# Patient Record
Sex: Female | Born: 1995 | Race: Black or African American | Hispanic: No | Marital: Single | Smoking: Never smoker
Health system: Southern US, Community
[De-identification: ages and names within clinical notes are randomized; demographics above are authoritative.]

## PROBLEM LIST (undated history)

## (undated) DIAGNOSIS — Z789 Other specified health status: Secondary | ICD-10-CM

---
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
## 2017-09-14 ENCOUNTER — Encounter: Payer: Self-pay | Admitting: Obstetrics and Gynecology

## 2017-09-14 ENCOUNTER — Encounter: Payer: Medicaid Other | Admitting: Obstetrics and Gynecology

## 2017-09-14 ENCOUNTER — Ambulatory Visit (INDEPENDENT_AMBULATORY_CARE_PROVIDER_SITE_OTHER): Payer: Medicaid Other | Admitting: Obstetrics and Gynecology

## 2017-09-14 VITALS — BP 128/68 | HR 107 | Wt 171.0 lb

## 2017-09-14 DIAGNOSIS — E669 Obesity, unspecified: Secondary | ICD-10-CM

## 2017-09-14 DIAGNOSIS — O219 Vomiting of pregnancy, unspecified: Secondary | ICD-10-CM

## 2017-09-14 DIAGNOSIS — Z3A01 Less than 8 weeks gestation of pregnancy: Secondary | ICD-10-CM

## 2017-09-14 DIAGNOSIS — Z3400 Encounter for supervision of normal first pregnancy, unspecified trimester: Secondary | ICD-10-CM | POA: Insufficient documentation

## 2017-09-14 DIAGNOSIS — Z6841 Body Mass Index (BMI) 40.0 and over, adult: Secondary | ICD-10-CM | POA: Insufficient documentation

## 2017-09-14 MED ORDER — DOXYLAMINE-PYRIDOXINE 10-10 MG PO TBEC: 2.0000 | DELAYED_RELEASE_TABLET | Freq: Every day | ORAL | 5 refills | Status: DC

## 2017-09-14 MED ORDER — PRENATAL VITAMINS 0.8 MG PO TABS
1.0000 | ORAL_TABLET | Freq: Every day | ORAL | 12 refills | Status: DC
Start: 2017-09-14 — End: 2021-09-01

## 2017-09-14 NOTE — Progress Notes (Signed)
09/14/2017   Chief Complaint: Missed period  Transfer of Care Patient: no  History of Present Illness: Amy Duncan is a 22 y.o. G1P0000 7447w2d based on Patient's last menstrual period was 07/25/2017 (exact date). with an Estimated Date of Delivery: 05/01/18, with the above CC.   Her periods were: regular periods every 28 days She was using no method when she conceived.  She has Positive signs or symptoms of nausea/vomiting of pregnancy. She has Negative signs or symptoms of miscarriage or preterm labor She identifies Negative Zika risk factors for her and her partner On any different medications around the time she conceived/early pregnancy: No  History of varicella: No   ROS: A 12-point review of systems was performed and negative, except as stated in the above HPI.  OBGYN History: As per HPI. OB History  Gravida Para Term Preterm AB Living  1 0 0     0  SAB TAB Ectopic Multiple Live Births               # Outcome Date GA Lbr Len/2nd Weight Sex Delivery Anes PTL Lv  1 Current               Any issues with any prior pregnancies: not applicable Any prior children are healthy, doing well, without any problems or issues: not applicable History of pap smears: No.  History of STIs: No   Past Medical History: History reviewed. No pertinent past medical history.  Past Surgical History: History reviewed. No pertinent surgical history.  Family History:  History reviewed. No pertinent family history. She denies any female cancers, bleeding or blood clotting disorders.  She denies any history of mental retardation, birth defects or genetic disorders in her or the FOB's history  Social History:  Social History   Socioeconomic History  . Marital status: Single    Spouse name: Not on file  . Number of children: Not on file  . Years of education: Not on file  . Highest education level: Not on file  Social Needs  . Financial resource strain: Not on file  . Food insecurity -  worry: Not on file  . Food insecurity - inability: Not on file  . Transportation needs - medical: Not on file  . Transportation needs - non-medical: Not on file  Occupational History  . Not on file  Tobacco Use  . Smoking status: Former Games developermoker  . Smokeless tobacco: Never Used  . Tobacco comment: Quit with current pregnancy  Substance and Sexual Activity  . Alcohol use: No    Frequency: Never  . Drug use: No  . Sexual activity: Yes    Birth control/protection: None  Other Topics Concern  . Not on file  Social History Narrative  . Not on file   Any pets in the household: no Discussed cat liter precautions  Allergy: No Known Allergies  Current Outpatient Medications: No current outpatient medications on file.   Physical Exam:   BP 128/68   Pulse (!) 107   Wt 171 lb (77.6 kg)   LMP 07/25/2017 (Exact Date)  There is no height or weight on file to calculate BMI. Constitutional: Well nourished, well developed female in no acute distress.  Neck:  Supple, normal appearance, and no thyromegaly  Cardiovascular: S1, S2 normal, no murmur, rub or gallop, regular rate and rhythm Respiratory:  Clear to auscultation bilateral. Normal respiratory effort Abdomen: positive bowel sounds and no masses, hernias; diffusely non tender to palpation, non distended Breasts: breasts appear  normal, no suspicious masses, no skin or nipple changes or axillary nodes. Neuro/Psych:  Normal mood and affect.  Skin:  Warm and dry.  Lymphatic:  No inguinal lymphadenopathy.   Patient declined pelvic exam. Vaginal swab collected for gonorrhea and chlamydia  Transabdominal US showed intrauterine gestational sac, no yolk sac or fetal pole seen. Patient declined transvaginal US.    Assessment: Amy Duncan is a 22 y.o. G1P0000 [redacted]w[redacted]d based on Patient's last menstrual period was 07/25/2017 (exact date). with an Estimated Date of Delivery: 05/01/18,  for prenatal care.  Plan:  1) Avoid alcoholic beverages. 2)  Patient encouraged not to smoke.  3) Discontinue the use of all non-medicinal drugs and chemicals.  4) Take prenatal vitamins daily.  5) Seatbelt use advised 6) Nutrition, food safety (fish, cheese advisories, and high nitrite foods) and exercise discussed. 7) Hospital and practice style delivering at [facility] discussed  8) Patient is asked about travel to areas at risk for the Zika virus, and counseled to avoid travel and exposure to mosquitoes or sexual partners who may have themselves been exposed to the virus. Testing is discussed, and will be ordered as appropriate.  9) Childbirth classes at [facility] advised 10) Genetic Screening, such as with 1st Trimester Screening, cell free fetal DNA, AFP testing, and Ultrasound, as well as with amniocentesis and CVS as appropriate, is discussed with patient. She plans to have genetic testing this pregnancy. Desires CF DNA 11) Hemoglobin electrophoresis, will add on 12)  Prescriptions for prenatal vitamins and diclegis sent. 13) Return next week for transvaginal US for viability and dating.   Problem list reviewed and updated.  Amy Idler MD [facility], [facility] 09/14/2017  5:57 PM

## 2017-09-15 LAB — RPR+RH+ABO+RUB AB+AB SCR+CB...
Antibody Screen: NEGATIVE
HEMATOCRIT: 39.2 % (ref 34.0–46.6)
HEMOGLOBIN: 13.8 g/dL (ref 11.1–15.9)
HIV Screen 4th Generation wRfx: NONREACTIVE
Hepatitis B Surface Ag: NEGATIVE
MCH: 29.9 pg (ref 26.6–33.0)
MCHC: 35.2 g/dL (ref 31.5–35.7)
MCV: 85 fL (ref 79–97)
Platelets: 377 10*3/uL (ref 150–379)
RBC: 4.62 x10E6/uL (ref 3.77–5.28)
RDW: 13.3 % (ref 12.3–15.4)
RH TYPE: POSITIVE
RPR: NONREACTIVE
Rubella Antibodies, IGG: 3.33 index (ref >0.99)
Varicella zoster IgG: <135 index — ABNORMAL LOW (ref >165)
WBC: 12.2 10*3/uL — ABNORMAL HIGH (ref 3.4–10.8)

## 2017-09-16 LAB — GC/CHLAMYDIA PROBE AMP
CHLAMYDIA, DNA PROBE: NEGATIVE
Neisseria gonorrhoeae by PCR: NEGATIVE

## 2017-09-16 LAB — SPECIMEN STATUS REPORT

## 2017-09-16 LAB — URINE DRUG PANEL 7
Amphetamines, Urine: NEGATIVE ng/mL
BARBITURATE QUANT UR: NEGATIVE ng/mL
BENZODIAZEPINE QUANT UR: NEGATIVE ng/mL
Cannabinoid Quant, Ur: NEGATIVE ng/mL
Cocaine (Metab.): NEGATIVE ng/mL
OPIATE QUANT UR: NEGATIVE ng/mL
PCP Quant, Ur: NEGATIVE ng/mL

## 2017-09-16 LAB — URINE CULTURE

## 2017-09-16 LAB — SICKLE CELL SCREEN: Sickle Cell Screen: NEGATIVE

## 2017-09-16 NOTE — Progress Notes (Signed)
Please call patient with results. She is not immune to varicella so she should avoid people with chicken pox and she can be revaccinated postpartum. Thank you!

## 2017-09-22 ENCOUNTER — Encounter: Payer: Self-pay | Admitting: Advanced Practice Midwife

## 2017-09-22 ENCOUNTER — Ambulatory Visit (INDEPENDENT_AMBULATORY_CARE_PROVIDER_SITE_OTHER): Payer: Medicaid Other

## 2017-09-22 ENCOUNTER — Other Ambulatory Visit: Payer: Self-pay | Admitting: Obstetrics and Gynecology

## 2017-09-22 ENCOUNTER — Ambulatory Visit (INDEPENDENT_AMBULATORY_CARE_PROVIDER_SITE_OTHER): Payer: Medicaid Other | Admitting: Advanced Practice Midwife

## 2017-09-22 VITALS — BP 120/60 | Wt 174.0 lb

## 2017-09-22 DIAGNOSIS — Z3400 Encounter for supervision of normal first pregnancy, unspecified trimester: Secondary | ICD-10-CM | POA: Diagnosis not present

## 2017-09-22 NOTE — Progress Notes (Signed)
Dating scan today: Location: [facility] Date of Service: 09/22/2017   Indications:dating Findings:  [name] intrauterine pregnancy is visualized with a CRL consistent with 4044w0d gestation, giving an (U/S) EDD of 05/11/2018.   Cardiac activity is absent CRL measurement: 9.7 mm Yolk sac is possible small yolk sac is seen. and early anatomy is normal. Amnion: is visualized   Right Ovary is normal in appearance. Left Ovary is normal appearance. Corpus luteal cyst:  is not visualized Survey of the adnexa demonstrates no adnexal masses. There is no free peritoneal fluid in the cul de sac.  Impression: 1. Cardiac activity is absent. 2. GS is in lower uterine segment. 3. Possible small yolk sac is seen. 4. [name] measures 1.78 x 2.20 cm   Recommendations: 1.Clinical correlation with the patient's History and Physical Exam. 2. Need to follow up.  Amy Duncan Leodis BinetP Patel, RDMS ________________________________________________________  Discussion with patient about possibility of early gestation vs non-viable pregnancy and the need for follow up u/s. All questions answered. She has had some spotting a couple of weeks ago and some ongoing cramping. She denies heavy bleeding. Precautions given. Patient to return in 2 weeks for f/u.  Tresea MallJane Cohen Boettner, CNM

## 2017-09-26 NOTE — Progress Notes (Signed)
This patient can be offered medical or surgical management of missed abortion.

## 2017-09-27 ENCOUNTER — Telehealth: Payer: Self-pay | Admitting: Obstetrics and Gynecology

## 2017-09-27 NOTE — Telephone Encounter (Signed)
Did you call pt?

## 2017-09-27 NOTE — Telephone Encounter (Signed)
Patient is calling due to missed call. Please advise °

## 2017-09-27 NOTE — Telephone Encounter (Signed)
Called and spoke with patient regarding results/follow up.

## 2017-09-27 NOTE — Telephone Encounter (Signed)
call

## 2017-10-06 ENCOUNTER — Other Ambulatory Visit: Payer: Self-pay | Admitting: Advanced Practice Midwife

## 2017-10-06 ENCOUNTER — Ambulatory Visit (INDEPENDENT_AMBULATORY_CARE_PROVIDER_SITE_OTHER): Payer: Medicaid Other | Admitting: Maternal Newborn

## 2017-10-06 ENCOUNTER — Ambulatory Visit (INDEPENDENT_AMBULATORY_CARE_PROVIDER_SITE_OTHER): Payer: Medicaid Other

## 2017-10-06 VITALS — BP 118/68 | Wt 175.0 lb

## 2017-10-06 DIAGNOSIS — Z3400 Encounter for supervision of normal first pregnancy, unspecified trimester: Secondary | ICD-10-CM

## 2017-10-06 DIAGNOSIS — O039 Complete or unspecified spontaneous abortion without complication: Secondary | ICD-10-CM

## 2017-10-08 ENCOUNTER — Encounter: Payer: Self-pay | Admitting: Maternal Newborn

## 2017-10-08 NOTE — Progress Notes (Signed)
    Prenatal Care Visit  Subjective  Amy Duncan is a 22 y.o. G1P0000 at 3322w5d being seen today to determine pregnancy viability.  Patient reports some cramping and light bleeding.   ----------------------------------------------------------------------------------- The following portions of the patient's history were reviewed and updated as appropriate: allergies, current medications, past family history, past medical history, past social history, past surgical history and problem list. Problem list updated.  Objective  Blood pressure 118/68, weight 175 lb (79.4 kg), last menstrual period 07/25/2017. No acute distress.  Assessment   22 y.o. G1P0000 at 2122w5d EDD 05/01/2018, by Last Menstrual Period presenting for assessment of fetal viability.  Plan  Ultrasound today shows 6w 4d gestation without cardiac activity, consistent with miscarriage.  ULTRASOUND REPORT  Location: [facility] Date of Service: 10/06/2017   Indications:VIABILITY Findings:  [name] intrauterine pregnancy is visualized with a CRL consistent with 827w4d gestation, giving an (U/S) EDD of 05/28/2018. Cardiac activity is absent   CRL measurement: 7.2 mm Yolk sac is visualized and appears abnormal and early anatomy is normal. Amnion: not visualized   Right Ovary is normal in appearance. Left Ovary is normal appearance. Corpus luteal cyst:  is not visualized Survey of the adnexa demonstrates no adnexal masses. There is no free peritoneal fluid in the cul de sac.  Impression: 1. Miscarriage  Recommendations: 1.Clinical correlation with the patient's History and Physical Exam.   Mital bahen P Patel,RDMS  Condolences were offered to the patient. We briefly discussed management options including expectant management, medical management, and surgical management, and that approximately 80% of first trimester miscarriages will pass successfully but may require a time frame of up to 8 weeks  (ACOG Practice Bulletin 150 May 2015 "Early Pregnancy Loss").  ? At this time, the patient desires expectant management. She was advised to contact the office should she change her mind, experience any complications, or have any follow up questions.  ? The patient is Rh positive and Rhogam is therefore not indicated.  ? A total of 15 minutes were spent in face-to-face contact with the patient during this encounter with over half of that time devoted to counseling and coordination of care.  ? Marcelyn BruinsJacelyn Schmid, CNM 10/06/2016

## 2018-07-24 ENCOUNTER — Encounter (HOSPITAL_COMMUNITY): Payer: Self-pay

## 2019-01-29 ENCOUNTER — Other Ambulatory Visit: Payer: Self-pay

## 2019-01-29 ENCOUNTER — Other Ambulatory Visit (HOSPITAL_COMMUNITY)
Admission: RE | Admit: 2019-01-29 | Discharge: 2019-01-29 | Disposition: A | Discharge status: Home / Self Care | Payer: Medicaid Other | Source: Ambulatory Visit | Attending: Maternal Newborn | Admitting: Maternal Newborn

## 2019-01-29 ENCOUNTER — Ambulatory Visit (INDEPENDENT_AMBULATORY_CARE_PROVIDER_SITE_OTHER): Payer: Medicaid Other | Admitting: Maternal Newborn

## 2019-01-29 ENCOUNTER — Encounter: Payer: Self-pay | Admitting: Maternal Newborn

## 2019-01-29 VITALS — BP 100/60 | Wt 170.0 lb

## 2019-01-29 DIAGNOSIS — Z3481 Encounter for supervision of other normal pregnancy, first trimester: Secondary | ICD-10-CM

## 2019-01-29 DIAGNOSIS — Z3480 Encounter for supervision of other normal pregnancy, unspecified trimester: Secondary | ICD-10-CM | POA: Diagnosis present

## 2019-01-29 DIAGNOSIS — Z3A10 10 weeks gestation of pregnancy: Secondary | ICD-10-CM | POA: Diagnosis not present

## 2019-01-29 DIAGNOSIS — Z6834 Body mass index (BMI) 34.0-34.9, adult: Secondary | ICD-10-CM

## 2019-01-29 DIAGNOSIS — Z369 Encounter for antenatal screening, unspecified: Secondary | ICD-10-CM

## 2019-01-29 NOTE — Patient Instructions (Signed)

## 2019-01-29 NOTE — Progress Notes (Signed)
01/29/2019   Chief Complaint: Desires prenatal care.  Transfer of Care Patient: No  History of Present Illness: Ms. Earlene PlaterDavis is a [age] y.o. G2P0010 at 1823w1d based on Patient's last menstrual period on 11/19/2018 (exact date), with an Estimated Date of Delivery: 08/26/2019, with the above CC.   Her periods were: regular periods every 28 days, lasting 7 days. Her LMP was short and consisted of spotting lasting 2 days. She has Positive signs or symptoms of nausea/vomiting of pregnancy. She has Negative signs or symptoms of miscarriage or preterm labor She identifies Negative Zika risk factors for her and her partner On any different medications around the time she conceived/early pregnancy: No  History of varicella: No   Review of Systems  Constitutional:       Change in appetite  HENT: Negative.   Eyes: Negative.   Respiratory: Negative for shortness of breath and wheezing.   Cardiovascular: Negative for chest pain and palpitations.  Gastrointestinal: Positive for nausea.  Genitourinary: Negative.   Musculoskeletal: Negative.   Skin: Negative.   Neurological: Positive for headaches.  Endo/Heme/Allergies: Negative.   Psychiatric/Behavioral: Negative.   Breasts: Positive for breast tenderness. Negative for - breast lumps.  Review of systems was otherwise negative, except as stated in the above HPI.  OBGYN History: As per HPI. OB History  Gravida Para Term Preterm AB Living  2 0 0   1 0  SAB TAB Ectopic Multiple Live Births  1            # Outcome Date GA Lbr Len/2nd Weight Sex Delivery Anes PTL Lv  2 Current           1 SAB             Any issues with any prior pregnancies: SAB with G1 Any prior children are healthy, doing well, without any problems or issues: Not applicable History of pap smears: No. Last pap smear: not applicable, less than 21 years old History of STIs: No   Past Medical History: History reviewed. No pertinent past medical history.  Past Surgical History:  History reviewed. No pertinent surgical history.  Family History:  Negative/unremarkable except as detailed in HPI. She denies any female cancers, bleeding or blood clotting disorders.  She denies any history of intellectual disability, birth defects or genetic disorders in her or the FOB's history  Social History:  Social History   Socioeconomic History  . Marital status: Single    Spouse name: Not on file  . Number of children: Not on file  . Years of education: Not on file  . Highest education level: Not on file  Occupational History  . Not on file  Social Needs  . Financial resource strain: Not on file  . Food insecurity    Worry: Not on file    Inability: Not on file  . Transportation needs    Medical: Not on file    Non-medical: Not on file  Tobacco Use  . Smoking status: Former Games developermoker  . Smokeless tobacco: Never Used  . Tobacco comment: Quit with current pregnancy  Substance and Sexual Activity  . Alcohol use: No    Frequency: Never  . Drug use: No  . Sexual activity: Yes    Partners: Male    Birth control/protection: None  Lifestyle  . Physical activity    Days per week: Not on file    Minutes per session: Not on file  . Stress: Not on file  Relationships  . Social  connections    Talks on phone: Not on file    Gets together: Not on file    Attends religious service: Not on file    Active member of club or organization: Not on file    Attends meetings of clubs or organizations: Not on file    Relationship status: Not on file  . Intimate partner violence    Fear of current or ex partner: Not on file    Emotionally abused: Not on file    Physically abused: Not on file    Forced sexual activity: Not on file  Other Topics Concern  . Not on file  Social History Narrative  . Not on file   Any cats in the household: no Domestic violence screening is negative.  Allergy: No Known Allergies  Current Outpatient Medications:  Current Outpatient  Medications:  .  Prenatal Multivit-Min-Fe-FA (PRENATAL VITAMINS) 0.8 MG tablet, Take 1 tablet by mouth daily., Disp: 30 tablet, Rfl: 12 .  Doxylamine-Pyridoxine (DICLEGIS) 10-10 MG TBEC, Take 2 tablets by mouth at bedtime. If symptoms persist, add one tablet in the morning and one in the afternoon (Patient not taking: Reported on 01/29/2019), Disp: 100 tablet, Rfl: 5   Physical Exam:   BP 100/60   Wt 170 lb (77.1 kg)   LMP 11/19/2018 (Exact Date)  There is no height or weight on file to calculate BMI. Constitutional: Well nourished, well developed female in no acute distress.  Neck:  Supple, normal appearance, and no thyromegaly  Cardiovascular: S1, S2 normal, no murmur, rub or gallop, regular rate and rhythm Respiratory:  Clear to auscultation bilaterally. Normal respiratory effort Abdomen: No masses, hernias; diffusely non tender to palpation, non distended Breasts: Patient declines to have breast exam. Neuro/Psych:  Normal mood and affect.  Skin:  Warm and dry.  Lymphatic:  No inguinal lymphadenopathy.  GU: External genitalia, Bartholin's glands, Urethra, Skene's glands: within normal limits Vagina: within normal limits and with no blood in the vault  Pelvic exam declined after shared decision making, asymptomatic  Assessment: Ms. Rini is a 23 y.o. G2P0010 at [redacted]w[redacted]d based on Patient's last menstrual period on 11/19/2018 (exact date), with an Estimated Date of Delivery: 08/26/2019, presenting for prenatal care.  Plan:  1) Avoid alcoholic beverages. 2) Patient encouraged not to smoke.  3) Discontinue the use of all non-medicinal drugs and chemicals.  4) Take prenatal vitamins daily.  5) Seatbelt use advised 6) Nutrition, food safety (fish, cheese advisories, and high nitrite foods) and exercise discussed. 7) Hospital and practice style delivering at [facility] discussed  8) Patient is asked about travel to areas at risk for the [place] virus, and counseled to avoid travel and exposure to  mosquitoes or  partners who may have themselves been exposed to the virus. 9) Childbirth classes at [facility] advised 10) Genetic Screening, such as with 1st Trimester Screening, cell free fetal DNA, AFP testing, and Ultrasound was discussed with patient. She plans to have genetic testing this pregnancy. 11) Dating scan ordered. 12) Early GTT ordered, to return for this and NOB labs. Please add genetic screening if appropriate dating confirmed. 13) Declines medication for nausea at this time.  Problem list reviewed and updated.  Avel Sensor, CNM [facility], [facility] 01/29/2019  1:16 PM

## 2019-01-29 NOTE — Progress Notes (Signed)
NOB- confirmed at [facility]-no concerns

## 2019-01-30 LAB — CERVICOVAGINAL ANCILLARY ONLY
Chlamydia: NEGATIVE
Neisseria Gonorrhea: NEGATIVE

## 2019-01-30 LAB — URINE DRUG PANEL 7
Amphetamines, Urine: NEGATIVE ng/mL
Barbiturate Quant, Ur: NEGATIVE ng/mL
Benzodiazepine Quant, Ur: NEGATIVE ng/mL
Cannabinoid Quant, Ur: NEGATIVE ng/mL
Cocaine (Metab.): NEGATIVE ng/mL
Opiate Quant, Ur: NEGATIVE ng/mL
PCP Quant, Ur: NEGATIVE ng/mL

## 2019-01-31 LAB — URINE CULTURE

## 2019-02-07 ENCOUNTER — Ambulatory Visit (INDEPENDENT_AMBULATORY_CARE_PROVIDER_SITE_OTHER): Payer: Medicaid Other

## 2019-02-07 ENCOUNTER — Encounter: Payer: Self-pay | Admitting: Advanced Practice Midwife

## 2019-02-07 ENCOUNTER — Other Ambulatory Visit: Payer: Medicaid Other

## 2019-02-07 ENCOUNTER — Ambulatory Visit (INDEPENDENT_AMBULATORY_CARE_PROVIDER_SITE_OTHER): Payer: Medicaid Other | Admitting: Advanced Practice Midwife

## 2019-02-07 ENCOUNTER — Other Ambulatory Visit: Payer: Self-pay

## 2019-02-07 VITALS — BP 114/74 | Wt 175.0 lb

## 2019-02-07 DIAGNOSIS — Z3A12 12 weeks gestation of pregnancy: Secondary | ICD-10-CM | POA: Diagnosis not present

## 2019-02-07 DIAGNOSIS — Z3480 Encounter for supervision of other normal pregnancy, unspecified trimester: Secondary | ICD-10-CM

## 2019-02-07 DIAGNOSIS — Z3A11 11 weeks gestation of pregnancy: Secondary | ICD-10-CM

## 2019-02-07 DIAGNOSIS — N8311 Corpus luteum cyst of right ovary: Secondary | ICD-10-CM

## 2019-02-07 DIAGNOSIS — Z3481 Encounter for supervision of other normal pregnancy, first trimester: Secondary | ICD-10-CM

## 2019-02-07 DIAGNOSIS — Z6834 Body mass index (BMI) 34.0-34.9, adult: Secondary | ICD-10-CM

## 2019-02-07 DIAGNOSIS — O3481 Maternal care for other abnormalities of pelvic organs, first trimester: Secondary | ICD-10-CM | POA: Diagnosis not present

## 2019-02-07 DIAGNOSIS — Z369 Encounter for antenatal screening, unspecified: Secondary | ICD-10-CM

## 2019-02-07 LAB — OB RESULTS CONSOLE VARICELLA ZOSTER ANTIBODY, IGG: Varicella: NON-IMMUNE/NOT IMMUNE

## 2019-02-07 NOTE — Progress Notes (Signed)
  Routine Prenatal Care Visit  Subjective  Amy Duncan is a 23 y.o. G2P0010 at [redacted]w[redacted]d being seen today for ongoing prenatal care.  She is currently monitored for the following issues for this low-risk pregnancy and has Obesity (BMI 30.0-34.9) and Supervision of other normal pregnancy, antepartum on their problem list.  ----------------------------------------------------------------------------------- Patient reports no complaints.    . Vag. Bleeding: None.   . Denies leaking of fluid.  ----------------------------------------------------------------------------------- The following portions of the patient's history were reviewed and updated as appropriate: allergies, current medications, past family history, past medical history, past social history, past surgical history and problem list. Problem list updated.   Objective  Blood pressure 114/74, weight 175 lb (79.4 kg), last menstrual period 11/19/2018 Pregravid weight 178 lb (80.7 kg) Total Weight Gain -3 lb (-1.361 kg) Urinalysis: Urine Protein    Urine Glucose    Fetal Status: Fetal Heart Rate (bpm): 154         Dating scan today consistent with LMP dating  General:  Alert, oriented and cooperative. Patient is in no acute distress.  Skin: Skin is warm and dry. No rash noted.   Cardiovascular: Normal heart rate noted  Respiratory: Normal respiratory effort, no problems with respiration noted  Abdomen: Soft, gravid, appropriate for gestational age. Pain/Pressure: Absent     Pelvic:  Cervical exam deferred        Extremities: Normal range of motion.     Mental Status: Normal mood and affect. Normal behavior. Normal judgment and thought content.   Assessment   23 y.o. G2P0010 at [redacted]w[redacted]d by  08/26/2019, by Last Menstrual Period presenting for routine prenatal visit  Plan   SECOND Problems (from 01/29/19 to present)    Problem Noted Resolved   Supervision of other normal pregnancy, antepartum 01/29/2019 by Rexene Agent, CNM No    Overview Addendum 01/29/2019  1:06 PM by Rexene Agent, [place] Prenatal Labs  Dating  Blood type:     Genetic Screen 1 Screen:    AFP:     Quad:     NIPS: Antibody:   Anatomic [place]  Rubella:   Varicella:    GTT Early:               Third trimester:  RPR:     Rhogam  HBsAg:     TDaP vaccine                       Flu Shot: HIV:     Baby Food                                GBS:   Contraception  Pap: N/A, < 21  CBB     CS/VBAC    Support Person                  Preterm labor symptoms and general obstetric precautions including but not limited to vaginal bleeding, contractions, leaking of fluid and fetal movement were reviewed in detail with the patient. Please refer to After Visit Summary for other counseling recommendations.   Return in about 4 weeks (around 03/07/2019) for rob.  Rod Can, CNM 02/07/2019 3:06 PM

## 2019-02-07 NOTE — Patient Instructions (Signed)

## 2019-02-07 NOTE — Progress Notes (Signed)
No vb. No lof. Early 1 hr gtt today and u/s

## 2019-02-11 LAB — MATERNIT 21 PLUS CORE, BLOOD
Fetal Fraction: 3
Result (T21): NEGATIVE
Trisomy 13 (Patau syndrome): NEGATIVE
Trisomy 18 (Edwards syndrome): NEGATIVE
Trisomy 21 (Down syndrome): NEGATIVE

## 2019-02-11 LAB — GLUCOSE, 1 HOUR GESTATIONAL: Gestational Diabetes Screen: 112 mg/dL (ref 65–139)

## 2019-02-11 LAB — RPR+RH+ABO+RUB AB+AB SCR+CB...
Antibody Screen: NEGATIVE
HIV Screen 4th Generation wRfx: NONREACTIVE
Hematocrit: 37 % (ref 34.0–46.6)
Hemoglobin: 13 g/dL (ref 11.1–15.9)
Hepatitis B Surface Ag: NEGATIVE
MCH: 29.6 pg (ref 26.6–33.0)
MCHC: 35.1 g/dL (ref 31.5–35.7)
MCV: 84 fL (ref 79–97)
Platelets: 345 10*3/uL (ref 150–450)
RBC: 4.39 x10E6/uL (ref 3.77–5.28)
RDW: 12.8 % (ref 11.7–15.4)
RPR Ser Ql: NONREACTIVE
Rh Factor: POSITIVE
Rubella Antibodies, IGG: 3.64 index (ref >0.99)
Varicella zoster IgG: <135 index — ABNORMAL LOW (ref >165)
WBC: 11.8 10*3/uL — ABNORMAL HIGH (ref 3.4–10.8)

## 2019-02-11 LAB — HEMOGLOBINOPATHY EVALUATION
HGB C: 0 %
HGB S: 0 %
HGB VARIANT: 0 %
Hemoglobin A2 Quantitation: 2.4 % (ref 1.8–3.2)
Hemoglobin F Quantitation: 0 % (ref 0.0–2.0)
Hgb A: 97.6 % (ref 96.4–98.8)

## 2019-02-13 ENCOUNTER — Telehealth: Payer: Self-pay

## 2019-02-13 NOTE — Telephone Encounter (Signed)
Pt is returning call - gender of baby.  [phone]  Also received a second call from this number but a different voice stating to call [phone] for gender reveal.  Left detailed msg on [phone] that I didn't call the [phone] # b/c is isn't in her chart nor was the name.  To please call and give permission for me to talk to this person.

## 2019-02-13 NOTE — Telephone Encounter (Signed)
Pt called back giving permission for me to give her sister, Oley Balm, the gender.  [phone]  Gender given.

## 2019-03-07 ENCOUNTER — Ambulatory Visit (INDEPENDENT_AMBULATORY_CARE_PROVIDER_SITE_OTHER): Payer: Medicaid Other | Admitting: Obstetrics & Gynecology

## 2019-03-07 ENCOUNTER — Encounter: Payer: Self-pay | Admitting: Obstetrics & Gynecology

## 2019-03-07 ENCOUNTER — Other Ambulatory Visit: Payer: Self-pay

## 2019-03-07 VITALS — BP 110/60 | Wt 177.0 lb

## 2019-03-07 DIAGNOSIS — Z3689 Encounter for other specified antenatal screening: Secondary | ICD-10-CM

## 2019-03-07 DIAGNOSIS — Z3482 Encounter for supervision of other normal pregnancy, second trimester: Secondary | ICD-10-CM

## 2019-03-07 DIAGNOSIS — Z3480 Encounter for supervision of other normal pregnancy, unspecified trimester: Secondary | ICD-10-CM

## 2019-03-07 DIAGNOSIS — Z3A15 15 weeks gestation of pregnancy: Secondary | ICD-10-CM

## 2019-03-07 LAB — POCT URINALYSIS DIPSTICK OB
Glucose, UA: NEGATIVE
POC,PROTEIN,UA: NEGATIVE

## 2019-03-07 NOTE — Patient Instructions (Signed)

## 2019-03-07 NOTE — Addendum Note (Signed)
Addended by: Gae Dry on: 03/07/2019 01:59 PM   Modules accepted: Orders

## 2019-03-07 NOTE — Progress Notes (Signed)
  Subjective  Fetal Movement? yes Contractions? no Leaking Fluid? no Vaginal Bleeding? no  Objective  BP 110/60   Wt 177 lb (80.3 kg)   LMP 11/19/2018 (Exact Date)  General: NAD Pumonary: no increased work of breathing Abdomen: gravid, non-tender Extremities: no edema Psychiatric: mood appropriate, affect full  Assessment  23 y.o. G2P0010 at [redacted]w[redacted]d by  08/26/2019, by Last Menstrual Period presenting for routine prenatal visit  Plan   Problem List Items Addressed This Visit      Other   Supervision of other normal pregnancy, antepartum    Other Visit Diagnoses    [redacted] weeks gestation of pregnancy    -  Primary   Relevant Orders   POC Urinalysis Dipstick OB (Completed)      SECOND Problems (from 01/29/19 to present)    Problem Noted Resolved   Supervision of other normal pregnancy, antepartum 01/29/2019 by Rexene Agent, CNM No   Overview Addendum 01/29/2019  1:06 PM by Rexene Agent, [place] Prenatal Labs  Dating [place] Blood type: B+     Genetic Screen  NIPS: nml XX Antibody: -  Anatomic [place] nv Rubella:I   Varicella:NI    GTT Early:               Third trimester:  RPR:   -  Rhogam B+ HBsAg:-     TDaP vaccine 32 wksFlu [id number] HIV:   -  Baby Food                               Breast GBS: p  Contraception unsure Pap: N/A, < 21  CBB  no   CS/VBAC n/a   Support Person                  Barnett Applebaum, MD, [facility], [facility] 03/07/2019  1:57 PM

## 2019-04-04 ENCOUNTER — Ambulatory Visit (INDEPENDENT_AMBULATORY_CARE_PROVIDER_SITE_OTHER): Payer: Medicaid Other | Admitting: Advanced Practice Midwife

## 2019-04-04 ENCOUNTER — Other Ambulatory Visit: Payer: Self-pay

## 2019-04-04 ENCOUNTER — Encounter: Payer: Self-pay | Admitting: Advanced Practice Midwife

## 2019-04-04 ENCOUNTER — Ambulatory Visit (INDEPENDENT_AMBULATORY_CARE_PROVIDER_SITE_OTHER): Payer: Medicaid Other

## 2019-04-04 ENCOUNTER — Telehealth: Payer: Self-pay

## 2019-04-04 VITALS — BP 118/78 | Wt 179.0 lb

## 2019-04-04 DIAGNOSIS — Z363 Encounter for antenatal screening for malformations: Secondary | ICD-10-CM

## 2019-04-04 DIAGNOSIS — Z3A19 19 weeks gestation of pregnancy: Secondary | ICD-10-CM

## 2019-04-04 DIAGNOSIS — Z3689 Encounter for other specified antenatal screening: Secondary | ICD-10-CM

## 2019-04-04 DIAGNOSIS — Z3480 Encounter for supervision of other normal pregnancy, unspecified trimester: Secondary | ICD-10-CM

## 2019-04-04 DIAGNOSIS — Z3482 Encounter for supervision of other normal pregnancy, second trimester: Secondary | ICD-10-CM

## 2019-04-04 NOTE — Telephone Encounter (Signed)
Pt says there is an outbreak of [place] cases at her job. All employees are getting tested. She wants to know if she can get a letter to stay out of work until Monday, at least until she has her results back. Please advise.

## 2019-04-04 NOTE — Progress Notes (Addendum)
  Routine Prenatal Care Visit  Subjective  Amy Duncan is a 23 y.o. G2P0010 at [redacted]w[redacted]d being seen today for ongoing prenatal care.  She is currently monitored for the following issues for this low-risk pregnancy and has Obesity (BMI 30.0-34.9) and Supervision of other normal pregnancy, antepartum on their problem list.  ----------------------------------------------------------------------------------- Patient reports no complaints.    . Vag. Bleeding: None.  Movement: Present. Leaking Fluid denies.  ----------------------------------------------------------------------------------- The following portions of the patient's history were reviewed and updated as appropriate: allergies, current medications, past family history, past medical history, past social history, past surgical history and problem list. Problem list updated.  Objective  Blood pressure 118/78, weight 179 lb (81.2 kg), last menstrual period 11/19/2018 Pregravid weight 178 lb (80.7 kg) Total Weight Gain 1 lb (0.454 kg) Urinalysis: Urine Protein    Urine Glucose    Fetal Status: Fetal Heart Rate (bpm): 145 Fundal Height: 20 cm Movement: Present      Anatomy scan today: complete but sub-optimal for cardiac views LVOT and RVOT and spinal views, and otherwise normal, female, placenta anterior  General:  Alert, oriented and cooperative. Patient is in no acute distress.  Skin: Skin is warm and dry. No rash noted.   Cardiovascular: Normal heart rate noted  Respiratory: Normal respiratory effort, no problems with respiration noted  Abdomen: Soft, gravid, appropriate for gestational age. Pain/Pressure: Absent     Pelvic:  Cervical exam deferred        Extremities: Normal range of motion.     Mental Status: Normal mood and affect. Normal behavior. Normal judgment and thought content.   Assessment   23 y.o. G2P0010 at 107w3d by  08/26/2019, by Last Menstrual Period presenting for routine prenatal visit  Plan   SECOND Problems  (from 01/29/19 to present)    Problem Noted Resolved   Supervision of other normal pregnancy, antepartum 01/29/2019 by Rexene Agent, CNM No   Overview Addendum 01/29/2019  1:06 PM by Rexene Agent, [place] Prenatal Labs  Dating  Blood type:     Genetic Screen 1 Screen:    AFP:     Quad:     NIPS: Antibody:   Anatomic [place]  Rubella:   Varicella:    GTT Early:               Third trimester:  RPR:     Rhogam  HBsAg:     TDaP vaccine                       Flu Shot: HIV:     Baby Food                                GBS:   Contraception  Pap: N/A, < 21  CBB     CS/VBAC    Support Person                  Preterm labor symptoms and general obstetric precautions including but not limited to vaginal bleeding, contractions, leaking of fluid and fetal movement were reviewed in detail with the patient. Please refer to After Visit Summary for other counseling recommendations.   Return in about 4 weeks (around 05/02/2019) for f/u anatomy scan and rob.  Rod Can, CNM 04/04/2019 4:32 PM

## 2019-04-04 NOTE — Progress Notes (Signed)
No vb. No lof. Anatomy scan today. 

## 2019-04-04 NOTE — Telephone Encounter (Signed)
Note is ready at the front of the office for pick up.

## 2019-04-04 NOTE — Addendum Note (Signed)
Addended by: Rod Can on: 04/04/2019 04:32 PM   Modules accepted: Orders

## 2019-04-04 NOTE — Telephone Encounter (Signed)
Pt aware.

## 2019-04-09 ENCOUNTER — Other Ambulatory Visit: Payer: Self-pay | Admitting: Obstetrics & Gynecology

## 2019-04-09 ENCOUNTER — Telehealth: Payer: Self-pay

## 2019-04-09 NOTE — Telephone Encounter (Signed)
Understandable.  Usual quarantine time is 14 days.  Wil write note for that.

## 2019-04-09 NOTE — Telephone Encounter (Signed)
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

## 2019-04-10 NOTE — Telephone Encounter (Signed)
Pt aware note is ready for pick up 

## 2019-05-02 ENCOUNTER — Other Ambulatory Visit: Payer: Self-pay

## 2019-05-02 ENCOUNTER — Encounter: Payer: Self-pay | Admitting: Advanced Practice Midwife

## 2019-05-02 ENCOUNTER — Ambulatory Visit (INDEPENDENT_AMBULATORY_CARE_PROVIDER_SITE_OTHER): Payer: Medicaid Other

## 2019-05-02 ENCOUNTER — Ambulatory Visit (INDEPENDENT_AMBULATORY_CARE_PROVIDER_SITE_OTHER): Payer: Medicaid Other | Admitting: Advanced Practice Midwife

## 2019-05-02 VITALS — BP 124/74 | Wt 181.0 lb

## 2019-05-02 DIAGNOSIS — Z3A23 23 weeks gestation of pregnancy: Secondary | ICD-10-CM

## 2019-05-02 DIAGNOSIS — Z130 Encounter for screening for diseases of the blood and blood-forming organs and certain disorders involving the immune mechanism: Secondary | ICD-10-CM

## 2019-05-02 DIAGNOSIS — Z131 Encounter for screening for diabetes mellitus: Secondary | ICD-10-CM

## 2019-05-02 DIAGNOSIS — Z3480 Encounter for supervision of other normal pregnancy, unspecified trimester: Secondary | ICD-10-CM

## 2019-05-02 DIAGNOSIS — Z362 Encounter for other antenatal screening follow-up: Secondary | ICD-10-CM

## 2019-05-02 DIAGNOSIS — Z113 Encounter for screening for infections with a predominantly sexual mode of transmission: Secondary | ICD-10-CM

## 2019-05-02 DIAGNOSIS — Z3482 Encounter for supervision of other normal pregnancy, second trimester: Secondary | ICD-10-CM

## 2019-05-02 NOTE — Progress Notes (Signed)
Anatomy scan today. No vb. No lof.  ?

## 2019-05-02 NOTE — Progress Notes (Addendum)
  Routine Prenatal Care Visit  Subjective  Amy Duncan is a 23 y.o. G2P0010 at [redacted]w[redacted]d being seen today for ongoing prenatal care.  She is currently monitored for the following issues for this low-risk pregnancy and has Obesity (BMI 30.0-34.9) and Supervision of other normal pregnancy, antepartum on their problem list.  ----------------------------------------------------------------------------------- Patient reports no complaints.   Contractions: Not present. Vag. Bleeding: None.  Movement: Present. Leaking Fluid denies.  ----------------------------------------------------------------------------------- The following portions of the patient's history were reviewed and updated as appropriate: allergies, current medications, past family history, past medical history, past social history, past surgical history and problem list. Problem list updated.  Objective  Blood pressure 124/74, weight 181 lb (82.1 kg), last menstrual period 11/19/2018 Pregravid weight 178 lb (80.7 kg) Total Weight Gain 3 lb (1.361 kg) Urinalysis: Urine Protein    Urine Glucose    Fetal Status: Fetal Heart Rate (bpm): 150 Fundal Height: 24 cm Movement: Present      Anatomy scan is still incomplete for transverse spine.   General:  Alert, oriented and cooperative. Patient is in no acute distress.  Skin: Skin is warm and dry. No rash noted.   Cardiovascular: Normal heart rate noted  Respiratory: Normal respiratory effort, no problems with respiration noted  Abdomen: Soft, gravid, appropriate for gestational age. Pain/Pressure: Absent     Pelvic:  Cervical exam deferred        Extremities: Normal range of motion.  Edema: None  Mental Status: Normal mood and affect. Normal behavior. Normal judgment and thought content.   Assessment   23 y.o. G2P0010 at [redacted]w[redacted]d by  08/26/2019, by Last Menstrual Period presenting for routine prenatal visit  Plan   SECOND Problems (from 01/29/19 to present)    Problem Noted Resolved    Supervision of other normal pregnancy, antepartum 01/29/2019 by Rexene Agent, CNM No   Overview Addendum 01/29/2019  1:06 PM by Rexene Agent, [place] Prenatal Labs  Dating  Blood type:     Genetic Screen 1 Screen:    AFP:     Quad:     NIPS: Antibody:   Anatomic [place]  Rubella:   Varicella:    GTT Early:               Third trimester:  RPR:     Rhogam  HBsAg:     TDaP vaccine                       Flu Shot: HIV:     Baby Food                                GBS:   Contraception  Pap: N/A, < 21  CBB     CS/VBAC    Support Person                  Preterm labor symptoms and general obstetric precautions including but not limited to vaginal bleeding, contractions, leaking of fluid and fetal movement were reviewed in detail with the patient. Referral sent for f/u anatomy for transverse spine- not seen on f/u scan at [facility].  Return in about 4 weeks (around 05/30/2019) for 28 wk labs and rob.  Rod Can, CNM 05/02/2019 2:50 PM

## 2019-05-09 ENCOUNTER — Other Ambulatory Visit: Payer: Self-pay | Admitting: Advanced Practice Midwife

## 2019-05-09 ENCOUNTER — Other Ambulatory Visit: Payer: Self-pay

## 2019-05-09 DIAGNOSIS — IMO0002 Reserved for concepts with insufficient information to code with codable children: Secondary | ICD-10-CM

## 2019-05-09 DIAGNOSIS — Z0489 Encounter for examination and observation for other specified reasons: Secondary | ICD-10-CM

## 2019-05-09 DIAGNOSIS — Z3480 Encounter for supervision of other normal pregnancy, unspecified trimester: Secondary | ICD-10-CM

## 2019-05-13 ENCOUNTER — Ambulatory Visit
Admission: RE | Admit: 2019-05-13 | Discharge: 2019-05-13 | Disposition: A | Discharge status: Home / Self Care | Payer: Medicaid Other | Source: Ambulatory Visit | Attending: Obstetrics and Gynecology | Admitting: Obstetrics and Gynecology

## 2019-05-13 ENCOUNTER — Other Ambulatory Visit: Payer: Self-pay

## 2019-05-13 ENCOUNTER — Ambulatory Visit (HOSPITAL_BASED_OUTPATIENT_CLINIC_OR_DEPARTMENT_OTHER)
Admission: RE | Admit: 2019-05-13 | Discharge: 2019-05-13 | Disposition: A | Discharge status: Home / Self Care | Payer: Medicaid Other | Source: Ambulatory Visit | Attending: Advanced Practice Midwife | Admitting: Advanced Practice Midwife

## 2019-05-13 DIAGNOSIS — O99213 Obesity complicating pregnancy, third trimester: Secondary | ICD-10-CM | POA: Diagnosis present

## 2019-05-13 DIAGNOSIS — Z3480 Encounter for supervision of other normal pregnancy, unspecified trimester: Secondary | ICD-10-CM

## 2019-05-13 DIAGNOSIS — Z3A25 25 weeks gestation of pregnancy: Secondary | ICD-10-CM | POA: Diagnosis not present

## 2019-05-13 DIAGNOSIS — E669 Obesity, unspecified: Secondary | ICD-10-CM | POA: Diagnosis not present

## 2019-05-13 DIAGNOSIS — O09899 Supervision of other high risk pregnancies, unspecified trimester: Secondary | ICD-10-CM | POA: Diagnosis not present

## 2019-05-13 DIAGNOSIS — Z2839 Other underimmunization status: Secondary | ICD-10-CM

## 2019-05-13 DIAGNOSIS — Z283 Underimmunization status: Secondary | ICD-10-CM

## 2019-05-13 DIAGNOSIS — Z369 Encounter for antenatal screening, unspecified: Secondary | ICD-10-CM | POA: Diagnosis not present

## 2019-05-13 DIAGNOSIS — Z3689 Encounter for other specified antenatal screening: Secondary | ICD-10-CM | POA: Diagnosis not present

## 2019-05-13 DIAGNOSIS — O99212 Obesity complicating pregnancy, second trimester: Secondary | ICD-10-CM | POA: Diagnosis not present

## 2019-05-13 DIAGNOSIS — Z23 Encounter for immunization: Secondary | ICD-10-CM | POA: Insufficient documentation

## 2019-05-13 NOTE — Progress Notes (Signed)
[facility] Consultation   Chief Complaint:referral for elevated BMI  HPI: [name] is a 23 y.o. 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 She is now at [redacted]w[redacted]d by LMP 11/19/18 and earliest scan on 02/07/2019 at [facility] at 65 w 0d  who presents in consultation from  [facility] for elevated BMI and to complete the anatomy checklist on u/s . Pt reports she gained most of her weight when started using DepoProvera - hre mom reports the same pattern . No personal or family history of DM, HTN or asthma  Neg materniti 23  Negative early GCT   Past Medical History: Patient  has no past medical history on file.  Past Surgical History: She  has no past surgical history on file.  Obstetric History:  OB History    Gravida  2   Para  0   Term  0   Preterm      AB  1   Living  0     SAB  1   TAB      Ectopic      Multiple      Live Births             Gynecologic History:  Patient's last menstrual period was 11/19/2018 (exact date).  Pt received Gardasil    Medications:  Current Outpatient Medications:  .  Prenatal Multivit-Min-Fe-FA (PRENATAL VITAMINS) 0.8 MG tablet, Take 1 tablet by mouth daily., Disp: 30 tablet, Rfl: 12  Allergies: Patient has No Known Allergies.  Social History: Patient  reports that she has never smoked. She has never used smokeless tobacco. She reports that she does not drink alcohol or use drugs.  Family History: family history is not on file.  Review of Systems A full 12 point review of systems was negative or as noted in the History of Present Illness.  Physical Exam:  4 foot 11 inches  186 lbs  bmi 37  123/69 Hr 98  rr 18  Pulse ox 100  LMP 11/19/2018 (Exact Date)  Pleasant young short woman wearing a mask  Navel piercings   Asessement:   -Elevated BMI 37   Neg early GCT   Mother is obese as well   Declines visit to [facility]   -Varicella non immune  Advised to avoid infected children or directly contacting shingles lesion  Plan: Advised to limit weight gain to 10-15 lbs  Option of daily baby aspirin 81 mg given 2 risk factors for preeclampsia  Repeat GCT at 28 weeks   Varicella vaccination pp    Total time spent with the patient was 30 minutes with greater than 50% spent in counseling and coordination of care. We appreciate this interesting consult and will be happy to be involved in the ongoing care of Amy Duncan in anyway her obstetricians desire.  Gatha Mayer MD   [facility]

## 2019-05-31 ENCOUNTER — Encounter: Payer: Self-pay | Admitting: Obstetrics & Gynecology

## 2019-05-31 ENCOUNTER — Other Ambulatory Visit: Payer: Medicaid Other

## 2019-05-31 ENCOUNTER — Other Ambulatory Visit: Payer: Self-pay

## 2019-05-31 ENCOUNTER — Ambulatory Visit (INDEPENDENT_AMBULATORY_CARE_PROVIDER_SITE_OTHER): Payer: Medicaid Other | Admitting: Obstetrics & Gynecology

## 2019-05-31 VITALS — BP 120/80 | Wt 189.0 lb

## 2019-05-31 DIAGNOSIS — Z3480 Encounter for supervision of other normal pregnancy, unspecified trimester: Secondary | ICD-10-CM

## 2019-05-31 DIAGNOSIS — Z130 Encounter for screening for diseases of the blood and blood-forming organs and certain disorders involving the immune mechanism: Secondary | ICD-10-CM

## 2019-05-31 DIAGNOSIS — Z3A27 27 weeks gestation of pregnancy: Secondary | ICD-10-CM

## 2019-05-31 DIAGNOSIS — Z3482 Encounter for supervision of other normal pregnancy, second trimester: Secondary | ICD-10-CM

## 2019-05-31 DIAGNOSIS — Z113 Encounter for screening for infections with a predominantly sexual mode of transmission: Secondary | ICD-10-CM

## 2019-05-31 DIAGNOSIS — Z131 Encounter for screening for diabetes mellitus: Secondary | ICD-10-CM

## 2019-05-31 NOTE — Progress Notes (Signed)
  Subjective  Fetal Movement? yes Contractions? no Leaking Fluid? no Vaginal Bleeding? no  Objective  BP 120/80   Wt 189 lb (85.7 kg)   LMP 11/19/2018 (Exact Date)   BMI 38.17 kg/m  General: NAD Pumonary: no increased work of breathing Abdomen: gravid, non-tender Extremities: no edema Psychiatric: mood appropriate, affect full  Assessment  23 y.o. G2P0010 at [redacted]w[redacted]d by  08/26/2019, by Last Menstrual Period presenting for routine prenatal visit  Plan   Problem List Items Addressed This Visit      Other   Supervision of other normal pregnancy, antepartum    Other Visit Diagnoses    [redacted] weeks gestation of pregnancy    -  Primary      SECOND Problems (from 01/29/19 to present)    Problem Noted Resolved   Maternal varicella, non-immune 05/13/2019 by Gatha Mayer, MD No   Overview Signed 05/13/2019  9:56 AM by Gatha Mayer, MD    Vaccinate pp       Supervision of other normal pregnancy, antepartum 01/29/2019 by Rexene Agent, CNM No   Overview Addendum 01/29/2019  1:06 PM by Rexene Agent, CNM    Clinic [facility] Prenatal Labs  Dating [place] Blood type:   B+  Genetic Screen NIPS: XX nml Antibody: -  Anatomic [place] [facility] Rubella: Im  Varicella:NonImm    GTT Early:112 Third trimester:  RPR:   -  Rhogam B+ HBsAg: -    TDaP vaccine                       Flu Shot: HIV:   -  Baby Food Unsure                               GBS:   Contraception Unsure Pap: N/A, < 21  CBB  no   CS/VBAC n/a   Support Person                  Barnett Applebaum, MD, [facility], [facility] 05/31/2019  3:20 PM

## 2019-06-01 LAB — 28 WEEK RH+PANEL
Basophils Absolute: 0 10*3/uL (ref 0.0–0.2)
Basos: 0 %
EOS (ABSOLUTE): 0.1 10*3/uL (ref 0.0–0.4)
Eos: 1 %
Gestational Diabetes Screen: 127 mg/dL (ref 65–139)
HIV Screen 4th Generation wRfx: NONREACTIVE
Hematocrit: 31.5 % — ABNORMAL LOW (ref 34.0–46.6)
Hemoglobin: 11.1 g/dL (ref 11.1–15.9)
Immature Grans (Abs): 0.1 10*3/uL (ref 0.0–0.1)
Immature Granulocytes: 1 %
Lymphocytes Absolute: 2.3 10*3/uL (ref 0.7–3.1)
Lymphs: 18 %
MCH: 29.9 pg (ref 26.6–33.0)
MCHC: 35.2 g/dL (ref 31.5–35.7)
MCV: 85 fL (ref 79–97)
Monocytes Absolute: 0.6 10*3/uL (ref 0.1–0.9)
Monocytes: 5 %
Neutrophils Absolute: 9.5 10*3/uL — ABNORMAL HIGH (ref 1.4–7.0)
Neutrophils: 75 %
Platelets: 331 10*3/uL (ref 150–450)
RBC: 3.71 x10E6/uL — ABNORMAL LOW (ref 3.77–5.28)
RDW: 12.6 % (ref 11.7–15.4)
RPR Ser Ql: NONREACTIVE
WBC: 12.7 10*3/uL — ABNORMAL HIGH (ref 3.4–10.8)

## 2019-06-24 ENCOUNTER — Ambulatory Visit (INDEPENDENT_AMBULATORY_CARE_PROVIDER_SITE_OTHER): Payer: Medicaid Other | Admitting: Obstetrics and Gynecology

## 2019-06-24 ENCOUNTER — Other Ambulatory Visit: Payer: Self-pay

## 2019-06-24 ENCOUNTER — Encounter: Payer: Self-pay | Admitting: Obstetrics and Gynecology

## 2019-06-24 VITALS — BP 110/58 | Wt 191.0 lb

## 2019-06-24 DIAGNOSIS — Z3483 Encounter for supervision of other normal pregnancy, third trimester: Secondary | ICD-10-CM

## 2019-06-24 DIAGNOSIS — Z23 Encounter for immunization: Secondary | ICD-10-CM | POA: Diagnosis not present

## 2019-06-24 DIAGNOSIS — Z3480 Encounter for supervision of other normal pregnancy, unspecified trimester: Secondary | ICD-10-CM

## 2019-06-24 DIAGNOSIS — Z3A30 30 weeks gestation of pregnancy: Secondary | ICD-10-CM

## 2019-06-24 NOTE — Progress Notes (Signed)
Routine Prenatal Care Visit  Subjective  Amy Duncan is a 23 y.o. G2P0010 at [redacted]w[redacted]d being seen today for ongoing prenatal care.  She is currently monitored for the following issues for this low-risk pregnancy and has Obesity (BMI 30.0-34.9); Supervision of other normal pregnancy, antepartum; and Maternal varicella, non-immune on their problem list.  ----------------------------------------------------------------------------------- Patient reports no complaints.   Contractions: Not present. Vag. Bleeding: None.  Movement: Present. Denies leaking of fluid.  ----------------------------------------------------------------------------------- The following portions of the patient's history were reviewed and updated as appropriate: allergies, current medications, past family history, past medical history, past social history, past surgical history and problem list. Problem list updated.   Objective  Blood pressure (!) 110/58, weight 191 lb (86.6 kg), last menstrual period 11/19/2018, unknown if currently breastfeeding. Pregravid weight 178 lb (80.7 kg) Total Weight Gain 13 lb (5.897 kg) Urinalysis:      Fetal Status: Fetal Heart Rate (bpm): 135 Fundal Height: 31 cm Movement: Present     General:  Alert, oriented and cooperative. Patient is in no acute distress.  Skin: Skin is warm and dry. No rash noted.   Cardiovascular: Normal heart rate noted  Respiratory: Normal respiratory effort, no problems with respiration noted  Abdomen: Soft, gravid, appropriate for gestational age. Pain/Pressure: Absent     Pelvic:  Cervical exam deferred        Extremities: Normal range of motion.  Edema: None  Mental Status: Normal mood and affect. Normal behavior. Normal judgment and thought content.     Assessment   23 y.o. G2P0010 at [redacted]w[redacted]d by  08/26/2019, by Last Menstrual Period presenting for routine prenatal visit  Plan   SECOND Problems (from 01/29/19 to present)    Problem Noted Resolved   Maternal varicella, non-immune 05/13/2019 by Gatha Mayer, MD No   Overview Signed 05/13/2019  9:56 AM by Gatha Mayer, MD    Vaccinate pp       Supervision of other normal pregnancy, antepartum 01/29/2019 by Rexene Agent, CNM No   Overview Addendum 06/24/2019  3:55 PM by Homero Fellers, [place] Prenatal Labs  Dating LMP =12wk [place] Blood type: B/Positive/-- (07/16 1516)   Genetic Screen  NIPS: normal XX Antibody:Negative (07/16 1516)  Anatomic [place] complete Rubella: 3.64 (07/16 1516) Varicella:  NONIMMUNE  GTT Early:  WNL   Third trimester: 127 RPR: Non Reactive (11/06 1603)   Rhogam Not needed HBsAg: Negative (07/16 1516)   TDaP vaccine 06/24/2019                       Flu Shot:Declines HIV: Non Reactive (11/06 1603)   Baby Food    Breast/bottle                       GBS:   Contraception  undecided, used patch before Pap: N/A, < 21  CBB     CS/VBAC  not applicable   Support Person                 Gestational age appropriate obstetric precautions including but not limited to vaginal bleeding, contractions, leaking of fluid and fetal movement were reviewed in detail with the patient.    Discussed options for birth control- undecided Discussed [email or web address]- planning bottle/breast, given booklet on breastfeeding. Tdap today  Return in about 2 weeks (around 07/08/2019) for [place] telephone visit.  Homero Fellers MD [facility], [facility] 06/24/2019, 3:44 PM

## 2019-06-24 NOTE — Patient Instructions (Signed)

## 2019-06-24 NOTE — Progress Notes (Signed)
ROB Tdap undecided /Bt consent today Declined flu shot No concerns Denies lof, no vb, Good FM

## 2019-07-09 ENCOUNTER — Other Ambulatory Visit: Payer: Self-pay

## 2019-07-09 ENCOUNTER — Telehealth: Payer: Medicaid Other | Admitting: Obstetrics & Gynecology

## 2019-07-17 ENCOUNTER — Encounter: Payer: Self-pay | Admitting: Advanced Practice Midwife

## 2019-07-17 ENCOUNTER — Other Ambulatory Visit: Payer: Self-pay

## 2019-07-17 ENCOUNTER — Ambulatory Visit (INDEPENDENT_AMBULATORY_CARE_PROVIDER_SITE_OTHER): Payer: Medicaid Other | Admitting: Advanced Practice Midwife

## 2019-07-17 VITALS — Wt 190.0 lb

## 2019-07-17 DIAGNOSIS — O99213 Obesity complicating pregnancy, third trimester: Secondary | ICD-10-CM | POA: Diagnosis not present

## 2019-07-17 DIAGNOSIS — Z3A34 34 weeks gestation of pregnancy: Secondary | ICD-10-CM | POA: Diagnosis not present

## 2019-07-17 NOTE — Progress Notes (Signed)
Telephone visit today no vb. No lof.

## 2019-07-17 NOTE — Progress Notes (Signed)
Routine Prenatal Care Visit- Virtual Visit  Subjective   Virtual Visit via Telephone Note  I connected with Amy Duncan on 07/17/19 at  1:30 PM EST by telephone and verified that I am speaking with the correct person using two identifiers.   I discussed the limitations, risks, security and privacy concerns of performing an evaluation and management service by telephone and the availability of in person appointments. I also discussed with the patient that there may be a patient responsible charge related to this service. The patient expressed understanding and agreed to proceed.  The patient was in her car I spoke with the patient from my  Office phone The names of people involved in this encounter were: Amy Duncan and myself Tresea Mall, CNM   Amy Duncan is a 23 y.o. G2P0010 at [redacted]w[redacted]d being seen today for ongoing prenatal care.  She is currently monitored for the following issues for this low-risk pregnancy and has Obesity (BMI 30.0-34.9); Supervision of other normal pregnancy, antepartum; and Maternal varicella, non-immune on their problem list.  ----------------------------------------------------------------------------------- Patient reports abdominal cramping- nothing regular.   Contractions: Not present. Vag. Bleeding: None.  Movement: Present. Denies leaking of fluid.  ----------------------------------------------------------------------------------- The following portions of the patient's history were reviewed and updated as appropriate: allergies, current medications, past family history, past medical history, past social history, past surgical history and problem list. Problem list updated.   Objective  Weight 190 lb (86.2 kg), last menstrual period 11/19/2018 Pregravid weight 178 lb (80.7 kg) Total Weight Gain 12 lb (5.443 kg) Urinalysis:      Fetal Status:     Movement: Present     Physical Exam could not be performed. Because of the COVID-19 outbreak this  visit was performed over the phone and not in person.   Assessment   23 y.o. G2P0010 at [redacted]w[redacted]d by  08/26/2019, by Last Menstrual Period presenting for routine prenatal visit  Plan   SECOND Problems (from 01/29/19 to present)    Problem Noted Resolved   Maternal varicella, non-immune 05/13/2019 by Jimmey Ralph, MD No   Overview Signed 05/13/2019  9:56 AM by Jimmey Ralph, MD    Vaccinate pp       Supervision of other normal pregnancy, antepartum 01/29/2019 by Oswaldo Conroy, CNM No   Overview Addendum 06/24/2019  3:55 PM by Natale Milch, MD    Clinic [facility] Prenatal Labs  Dating LMP =12wk [place] Blood type: B/Positive/-- (07/16 1516)   Genetic Screen  NIPS: normal XX Antibody:Negative (07/16 1516)  Anatomic [place] complete Rubella: 3.64 (07/16 1516) Varicella:  NONIMMUNE  GTT Early:  WNL   Third trimester: 127 RPR: Non Reactive (11/06 1603)   Rhogam Not needed HBsAg: Negative (07/16 1516)   TDaP vaccine 06/24/2019                       Flu Shot:Declines HIV: Non Reactive (11/06 1603)   Baby Food    Breast/bottle                       GBS:   Contraception  undecided, used patch before Pap: N/A, < 21  CBB     CS/VBAC  not applicable   Support Person                  Gestational age appropriate obstetric precautions including but not limited to vaginal bleeding, contractions, leaking of fluid and fetal movement were reviewed in detail  with the patient.     Follow Up Instructions: Increase hydration   I discussed the assessment and treatment plan with the patient. The patient was provided an opportunity to ask questions and all were answered. The patient agreed with the plan and demonstrated an understanding of the instructions.   The patient was advised to call back or seek an in-person evaluation if the symptoms worsen or if the condition fails to improve as anticipated.  I provided 21 minutes of non-face-to-face time during this encounter.  Return in  about 2 weeks (around 07/31/2019) for rob.   Rod Can, [facility] 07/17/2019, 1:43 PM

## 2019-08-02 ENCOUNTER — Encounter: Payer: Medicaid Other | Admitting: Obstetrics & Gynecology

## 2019-08-06 ENCOUNTER — Other Ambulatory Visit: Payer: Self-pay

## 2019-08-06 ENCOUNTER — Other Ambulatory Visit (HOSPITAL_COMMUNITY)
Admission: RE | Admit: 2019-08-06 | Discharge: 2019-08-06 | Disposition: A | Discharge status: Home / Self Care | Payer: Medicaid Other | Source: Ambulatory Visit | Attending: Certified Nurse Midwife | Admitting: Certified Nurse Midwife

## 2019-08-06 ENCOUNTER — Ambulatory Visit (INDEPENDENT_AMBULATORY_CARE_PROVIDER_SITE_OTHER): Payer: Medicaid Other | Admitting: Certified Nurse Midwife

## 2019-08-06 VITALS — BP 100/50 | Wt 200.0 lb

## 2019-08-06 DIAGNOSIS — Z3A37 37 weeks gestation of pregnancy: Secondary | ICD-10-CM

## 2019-08-06 DIAGNOSIS — Z3483 Encounter for supervision of other normal pregnancy, third trimester: Secondary | ICD-10-CM | POA: Diagnosis not present

## 2019-08-06 DIAGNOSIS — Z113 Encounter for screening for infections with a predominantly sexual mode of transmission: Secondary | ICD-10-CM

## 2019-08-06 DIAGNOSIS — Z3689 Encounter for other specified antenatal screening: Secondary | ICD-10-CM

## 2019-08-06 DIAGNOSIS — Z6841 Body Mass Index (BMI) 40.0 and over, adult: Secondary | ICD-10-CM

## 2019-08-06 DIAGNOSIS — Z3480 Encounter for supervision of other normal pregnancy, unspecified trimester: Secondary | ICD-10-CM

## 2019-08-06 DIAGNOSIS — Z3685 Encounter for antenatal screening for Streptococcus B: Secondary | ICD-10-CM

## 2019-08-06 LAB — FETAL NONSTRESS TEST

## 2019-08-06 LAB — OB RESULTS CONSOLE GC/CHLAMYDIA: Gonorrhea: NEGATIVE

## 2019-08-06 LAB — OB RESULTS CONSOLE GBS: GBS: NEGATIVE

## 2019-08-06 NOTE — Progress Notes (Signed)
C/O more lower abd pain/pressure that comes and goes.rj

## 2019-08-06 NOTE — Progress Notes (Signed)
ROB at 37wk1d: Baby active. Infrequent BH contractions. No vaginal bleeding or leakage of water. Has gained 10# since 11/30 and BMI now 40.40 kg/m2 BP 100/50 FH 39 cm NST: baseline 135-140 with accelerations to 150s to 160, moderate variability after eating lunch. No decelerations.  Plan: NST/growth scan next visit in 1 week Labor precautions [facility] instruction Pain relief in labor discussed  Farrel Conners, CNM

## 2019-08-08 LAB — CERVICOVAGINAL ANCILLARY ONLY
Chlamydia: NEGATIVE
Comment: NEGATIVE
Comment: NEGATIVE
Comment: NORMAL
Neisseria Gonorrhea: NEGATIVE
Trichomonas: NEGATIVE

## 2019-08-10 LAB — CULTURE, BETA STREP (GROUP B ONLY): Strep Gp B Culture: NEGATIVE

## 2019-08-13 ENCOUNTER — Ambulatory Visit (INDEPENDENT_AMBULATORY_CARE_PROVIDER_SITE_OTHER): Payer: Medicaid Other

## 2019-08-13 ENCOUNTER — Encounter: Payer: Self-pay | Admitting: Advanced Practice Midwife

## 2019-08-13 ENCOUNTER — Ambulatory Visit (INDEPENDENT_AMBULATORY_CARE_PROVIDER_SITE_OTHER): Payer: Medicaid Other | Admitting: Advanced Practice Midwife

## 2019-08-13 ENCOUNTER — Other Ambulatory Visit: Payer: Self-pay

## 2019-08-13 VITALS — BP 110/68 | Wt 200.0 lb

## 2019-08-13 DIAGNOSIS — Z362 Encounter for other antenatal screening follow-up: Secondary | ICD-10-CM

## 2019-08-13 DIAGNOSIS — Z3A38 38 weeks gestation of pregnancy: Secondary | ICD-10-CM

## 2019-08-13 DIAGNOSIS — Z3480 Encounter for supervision of other normal pregnancy, unspecified trimester: Secondary | ICD-10-CM

## 2019-08-13 DIAGNOSIS — Z3483 Encounter for supervision of other normal pregnancy, third trimester: Secondary | ICD-10-CM

## 2019-08-13 DIAGNOSIS — Z6841 Body Mass Index (BMI) 40.0 and over, adult: Secondary | ICD-10-CM

## 2019-08-13 NOTE — Progress Notes (Signed)
Routine Prenatal Care Visit  Subjective  Amy Duncan is a 24 y.o. G2P0010 at [redacted]w[redacted]d being seen today for ongoing prenatal care.  She is currently monitored for the following issues for this low-risk pregnancy and has Adult BMI 40.0-44.9 kg/sq m ([place]); Supervision of other normal pregnancy, antepartum; and Maternal varicella, non-immune on their problem list.  ----------------------------------------------------------------------------------- Patient reports some discomforts of third trimester and overall coping well.   Contractions: Not present. Vag. Bleeding: None.  Movement: Present. Leaking Fluid denies.  ----------------------------------------------------------------------------------- The following portions of the patient's history were reviewed and updated as appropriate: allergies, current medications, past family history, past medical history, past social history, past surgical history and problem list. Problem list updated.  Objective  Blood pressure 110/68, weight 200 lb (90.7 kg), last menstrual period 11/19/2018 Pregravid weight 178 lb (80.7 kg) Total Weight Gain 22 lb (9.979 kg) Urinalysis: Urine Protein    Urine Glucose    Fetal Status: Fetal Heart Rate (bpm): 137 Fundal Height: 38 cm Movement: Present      Growth scan: 34.7%, 6 pounds 12 ounces, AC 27.3%, AFI 54.2, cephalic  General:  Alert, oriented and cooperative. Patient is in no acute distress.  Skin: Skin is warm and dry. No rash noted.   Cardiovascular: Normal heart rate noted  Respiratory: Normal respiratory effort, no problems with respiration noted  Abdomen: Soft, gravid, appropriate for gestational age. Pain/Pressure: Present     Pelvic:  Cervical exam deferred        Extremities: Normal range of motion.  Edema: None  Mental Status: Normal mood and affect. Normal behavior. Normal judgment and thought content.   Assessment   24 y.o. G2P0010 at [redacted]w[redacted]d by  08/26/2019, by Last Menstrual Period presenting for  routine prenatal visit  Plan   SECOND Problems (from 01/29/19 to present)    Problem Noted Resolved   Maternal varicella, non-immune 05/13/2019 by Gatha Mayer, MD No   Overview Signed 05/13/2019  9:56 AM by Gatha Mayer, MD    Vaccinate pp       Supervision of other normal pregnancy, antepartum 01/29/2019 by Rexene Agent, CNM No   Overview Addendum 08/12/2019  9:34 PM by Dalia Heading, [place] Prenatal Labs  Dating LMP =12wk [place] Blood type: B/Positive/-- (07/16 1516)   Genetic Screen  NIPS: normal XX Antibody:Negative (07/16 1516)  Anatomic [place] complete Rubella: 3.64 (07/16 1516) Varicella:  NONIMMUNE  GTT Early:  WNL   Third trimester: 127 RPR: Non Reactive (11/06 1603)   Rhogam Not needed HBsAg: Negative (07/16 1516)   TDaP vaccine 06/24/2019                       Flu Shot:Declines HIV: Non Reactive (11/06 1603)   Baby Food    Breast/bottle                       GBS: negative  Contraception  undecided, used patch before Pap: N/A, < 21  CBB     CS/VBAC  not applicable   Support Person                  Term labor symptoms and general obstetric precautions including but not limited to vaginal bleeding, contractions, leaking of fluid and fetal movement were reviewed in detail with the patient. Please refer to After Visit Summary for other counseling recommendations.   Return in about 1 week (around 08/20/2019) for rob.  Rod Can,  CNM 08/13/2019 4:19 PM

## 2019-08-13 NOTE — Patient Instructions (Signed)
Braxton Hicks Contractions °Contractions of the uterus can occur throughout pregnancy, but they are not always a sign that you are in labor. You may have practice contractions called Braxton Hicks contractions. These false labor contractions are sometimes confused with true labor. °What are Braxton Hicks contractions? °Braxton Hicks contractions are tightening movements that occur in the muscles of the uterus before labor. Unlike true labor contractions, these contractions do not result in opening (dilation) and thinning of the cervix. Toward the end of pregnancy (32-34 weeks), Braxton Hicks contractions can happen more often and may become stronger. These contractions are sometimes difficult to tell apart from true labor because they can be very uncomfortable. You should not feel embarrassed if you go to the hospital with false labor. °Sometimes, the only way to tell if you are in true labor is for your health care provider to look for changes in the cervix. The health care provider will do a physical exam and may monitor your contractions. If you are not in true labor, the exam should show that your cervix is not dilating and your water has not broken. °If there are no other health problems associated with your pregnancy, it is completely safe for you to be sent home with false labor. You may continue to have Braxton Hicks contractions until you go into true labor. °How to tell the difference between true labor and false labor °True labor °· Contractions last 30-70 seconds. °· Contractions become very regular. °· Discomfort is usually felt in the top of the uterus, and it spreads to the lower abdomen and low back. °· Contractions do not go away with walking. °· Contractions usually become more intense and increase in frequency. °· The cervix dilates and gets thinner. °False labor °· Contractions are usually shorter and not as strong as true labor contractions. °· Contractions are usually irregular. °· Contractions  are often felt in the front of the lower abdomen and in the groin. °· Contractions may go away when you walk around or change positions while lying down. °· Contractions get weaker and are shorter-lasting as time goes on. °· The cervix usually does not dilate or become thin. °Follow these instructions at home: ° °· Take over-the-counter and prescription medicines only as told by your health care provider. °· Keep up with your usual exercises and follow other instructions from your health care provider. °· Eat and drink lightly if you think you are going into labor. °· If Braxton Hicks contractions are making you uncomfortable: °? Change your position from lying down or resting to walking, or change from walking to resting. °? Sit and rest in a tub of warm water. °? Drink enough fluid to keep your urine pale yellow. Dehydration may cause these contractions. °? Do slow and deep breathing several times an hour. °· Keep all follow-up prenatal visits as told by your health care provider. This is important. °Contact a health care provider if: °· You have a fever. °· You have continuous pain in your abdomen. °Get help right away if: °· Your contractions become stronger, more regular, and closer together. °· You have fluid leaking or gushing from your vagina. °· You pass blood-tinged mucus (bloody show). °· You have bleeding from your vagina. °· You have low back pain that you never had before. °· You feel your baby’s head pushing down and causing pelvic pressure. °· Your baby is not moving inside you as much as it used to. °Summary °· Contractions that occur before labor are   called Braxton Hicks contractions, false labor, or practice contractions. °· Braxton Hicks contractions are usually shorter, weaker, farther apart, and less regular than true labor contractions. True labor contractions usually become progressively stronger and regular, and they become more frequent. °· Manage discomfort from Braxton Hicks contractions  by changing position, resting in a warm bath, drinking plenty of water, or practicing deep breathing. °This information is not intended to replace advice given to you by your health care provider. Make sure you discuss any questions you have with your health care provider. °Document Revised: 06/23/2017 Document Reviewed: 11/24/2016 °Elsevier Patient Education © 2020 [employer] ° °

## 2019-08-13 NOTE — Progress Notes (Signed)
U/s today. No vb. No lof.  

## 2019-08-22 ENCOUNTER — Telehealth: Payer: Self-pay

## 2019-08-22 NOTE — Telephone Encounter (Signed)
Pt calling; will be 40wks Monday; can membrane sweep be done at appt tomorrow?  [phone]  Pt aware if things are favorable it can be done; if not favorable it won't be done.  Pt to ask provider tomorrow.

## 2019-08-23 ENCOUNTER — Other Ambulatory Visit: Payer: Self-pay

## 2019-08-23 ENCOUNTER — Encounter: Payer: Self-pay | Admitting: Advanced Practice Midwife

## 2019-08-23 ENCOUNTER — Ambulatory Visit (INDEPENDENT_AMBULATORY_CARE_PROVIDER_SITE_OTHER): Payer: Medicaid Other | Admitting: Advanced Practice Midwife

## 2019-08-23 VITALS — BP 120/80 | Wt 200.0 lb

## 2019-08-23 DIAGNOSIS — O99213 Obesity complicating pregnancy, third trimester: Secondary | ICD-10-CM

## 2019-08-23 DIAGNOSIS — Z3483 Encounter for supervision of other normal pregnancy, third trimester: Secondary | ICD-10-CM

## 2019-08-23 DIAGNOSIS — Z3A39 39 weeks gestation of pregnancy: Secondary | ICD-10-CM

## 2019-08-23 NOTE — H&P (Signed)
OB History & Physical   History of Present Illness:  Date of H&P: 08/23/2019  Chief Complaint: planned induction of labor  HPI:  Amy Duncan is a 24 y.o. G2P0010 female at [redacted]w[redacted]d dated by LMP.  Her pregnancy has been without complication until the last couple of weeks when her BMI reached 40. She is varicella non-immune.  She reports occasional contractions.   She denies leakage of fluid.   She denies vaginal bleeding.   She reports fetal movement.    Total weight gain for pregnancy: 22 lb (9.979 kg)   Obstetrical Problem List: SECOND Problems (from 01/29/19 to present)    Problem Noted Resolved   Maternal varicella, non-immune 05/13/2019 by Gatha Mayer, MD No   Overview Signed 05/13/2019  9:56 AM by Gatha Mayer, MD    Vaccinate pp       Supervision of other normal pregnancy, antepartum 01/29/2019 by Rexene Agent, CNM No   Overview Addendum 08/12/2019  9:34 PM by Dalia Heading, [place] Prenatal Labs  Dating LMP =12wk [place] Blood type: B/Positive/-- (07/16 1516)   Genetic Screen  NIPS: normal XX Antibody:Negative (07/16 1516)  Anatomic [place] complete Rubella: 3.64 (07/16 1516) Varicella:  NONIMMUNE  GTT Early:  WNL   Third trimester: 127 RPR: Non Reactive (11/06 1603)   Rhogam Not needed HBsAg: Negative (07/16 1516)   TDaP vaccine 06/24/2019                       Flu Shot:Declines HIV: Non Reactive (11/06 1603)   Baby Food    Breast/bottle                       GBS: negative  Contraception  undecided, used patch before Pap: N/A, < 21  CBB     CS/VBAC  not applicable   Support Person                  Maternal Medical History:  History reviewed. No pertinent past medical history.  History reviewed. No pertinent surgical history.  No Known Allergies  Prior to Admission medications   Medication Sig Start Date End Date Taking? Authorizing Provider  Prenatal Multivit-Min-Fe-FA (PRENATAL VITAMINS) 0.8 MG tablet Take 1 tablet by mouth  daily. 09/14/17   Schuman, Stefanie Libel, MD    OB History  Gravida Para Term Preterm AB Living  2 0 0   1 0  SAB TAB Ectopic Multiple Live Births  1            # Outcome Date GA Lbr Len/2nd Weight Sex Delivery Anes PTL Lv  2 Current           1 SAB             Prenatal care site: [facility]  Social History: She  reports that she has never smoked. She has never used smokeless tobacco. She reports that she does not drink alcohol or use drugs.  Family History: family history is not on file.    Review of Systems:  Review of Systems  Constitutional: Negative.   HENT: Negative.   Eyes: Negative.   Respiratory: Negative.   Cardiovascular: Negative.   Gastrointestinal: Negative.   Genitourinary: Negative.   Musculoskeletal: Negative.   Skin: Negative.   Neurological: Negative.   Endo/Heme/Allergies: Negative.   Psychiatric/Behavioral: Negative.      Physical Exam:  BP 120/80   Wt 200 lb (90.7 kg)  LMP 11/19/2018 (Exact Date)   BMI 40.40 kg/m   Constitutional: Well nourished, well developed female in no acute distress.  HEENT: normal Skin: Warm and dry.  Cardiovascular: Regular rate and rhythm.   Extremity: no edema  Respiratory: Clear to auscultation bilateral. Normal respiratory effort Abdomen: FHT present Back: no CVAT Neuro: DTRs 2+, Cranial nerves grossly intact Psych: Alert and Oriented x3. No memory deficits. Normal mood and affect.  MS: normal gait, normal bilateral lower extremity ROM/strength/stability.  Pelvic exam:  is not limited by body habitus EGBUS: within normal limits Vagina: within normal limits and with normal mucosa  Cervix: 1.5/50/-2    Pertinent Results:  Prenatal Labs Blood type/Rh B positive  Antibody screen negative  Rubella Immune  Varicella Not immune    RPR Non-reactive  HBsAg negative  HIV negative  GC negative  Chlamydia negative  Genetic screening Negative/xx  1 hour GTT 127  3 hour GTT NA  GBS negative on 1/12      No results found for: SARSCOV2NAA] results pending for 08/29/19 test  Assessment:  Amy Duncan is a 24 y.o. G37P0010 female at [redacted]w[redacted]d with planned induction of labor.   Plan:  1. Admit to Labor & Delivery  2. CBC, T&S, Clrs, IVF 3. GBS negative.   4. Fetal well-being: reassuring 5. Begin induction with cytotec if no cervical change  Tresea Mall, CNM 08/23/2019 3:56 PM

## 2019-08-23 NOTE — Progress Notes (Signed)
  Routine Prenatal Care Visit  Subjective  Amy Duncan is a 24 y.o. G2P0010 at [redacted]w[redacted]d being seen today for ongoing prenatal care.  She is currently monitored for the following issues for this low-risk pregnancy and has Adult BMI 40.0-44.9 kg/sq m ([facility]); Supervision of other normal pregnancy, antepartum; and Maternal varicella, non-immune on their problem list.  ----------------------------------------------------------------------------------- Patient reports no complaints.   Contractions: Not present. Vag. Bleeding: None.  Movement: Present. Leaking Fluid denies.  ----------------------------------------------------------------------------------- The following portions of the patient's history were reviewed and updated as appropriate: allergies, current medications, past family history, past medical history, past social history, past surgical history and problem list. Problem list updated.  Objective  Blood pressure 120/80, weight 200 lb (90.7 kg), last menstrual period 11/19/2018 Pregravid weight 178 lb (80.7 kg) Total Weight Gain 22 lb (9.979 kg) Urinalysis: Urine Protein    Urine Glucose    Fetal Status: Fetal Heart Rate (bpm): 130   Movement: Present  Presentation: Vertex  General:  Alert, oriented and cooperative. Patient is in no acute distress.  Skin: Skin is warm and dry. No rash noted.   Cardiovascular: Normal heart rate noted  Respiratory: Normal respiratory effort, no problems with respiration noted  Abdomen: Soft, gravid, appropriate for gestational age. Pain/Pressure: Present     Pelvic:  Cervical exam performed Dilation: 1.5 Effacement (%): 50 Station: -2, cervical sweep  Extremities: Normal range of motion.  Edema: None  Mental Status: Normal mood and affect. Normal behavior. Normal judgment and thought content.   Assessment   24 y.o. G2P0010 at [redacted]w[redacted]d by  08/26/2019, by Last Menstrual Period presenting for routine prenatal visit  Plan   SECOND Problems (from 01/29/19  to present)    Problem Noted Resolved   Maternal varicella, non-immune 05/13/2019 by Jimmey Ralph, MD No   Overview Signed 05/13/2019  9:56 AM by Jimmey Ralph, MD    Vaccinate pp       Supervision of other normal pregnancy, antepartum 01/29/2019 by Oswaldo Conroy, CNM No   Overview Addendum 08/12/2019  9:34 PM by Farrel Conners, CNM    Clinic [facility] Prenatal Labs  Dating LMP =12wk [place] Blood type: B/Positive/-- (07/16 1516)   Genetic Screen  NIPS: normal XX Antibody:Negative (07/16 1516)  Anatomic [place] complete Rubella: 3.64 (07/16 1516) Varicella:  NONIMMUNE  GTT Early:  WNL   Third trimester: 127 RPR: Non Reactive (11/06 1603)   Rhogam Not needed HBsAg: Negative (07/16 1516)   TDaP vaccine 06/24/2019                       Flu Shot:Declines HIV: Non Reactive (11/06 1603)   Baby Food    Breast/bottle                       GBS: negative  Contraception  undecided, used patch before Pap: N/A, < 21  CBB     CS/VBAC  not applicable   Support Person                  Term labor symptoms and general obstetric precautions including but not limited to vaginal bleeding, contractions, leaking of fluid and fetal movement were reviewed in detail with the patient.   Return for IOL.  IOL scheduled for 08/31/19 at 8 am Covid swab 08/29/19 between 9-10 am  Tresea Mall, [facility] 08/23/2019 3:22 PM

## 2019-08-27 ENCOUNTER — Encounter: Payer: Self-pay | Admitting: Obstetrics and Gynecology

## 2019-08-27 ENCOUNTER — Other Ambulatory Visit: Payer: Self-pay

## 2019-08-27 ENCOUNTER — Observation Stay
Admission: EM | Admit: 2019-08-27 | Discharge: 2019-08-27 | Disposition: A | Discharge status: Home / Self Care | Payer: Medicaid Other | Attending: Obstetrics and Gynecology | Admitting: Obstetrics and Gynecology

## 2019-08-27 DIAGNOSIS — Z3A40 40 weeks gestation of pregnancy: Secondary | ICD-10-CM | POA: Insufficient documentation

## 2019-08-27 DIAGNOSIS — O09899 Supervision of other high risk pregnancies, unspecified trimester: Secondary | ICD-10-CM

## 2019-08-27 DIAGNOSIS — O26853 Spotting complicating pregnancy, third trimester: Secondary | ICD-10-CM | POA: Diagnosis not present

## 2019-08-27 DIAGNOSIS — Z3480 Encounter for supervision of other normal pregnancy, unspecified trimester: Secondary | ICD-10-CM

## 2019-08-27 NOTE — OB Triage Note (Signed)
Pt G2P0010 [redacted]w[redacted]d complains of vaginal bleeding and contractions. Pt states she has not had contractions since last night and had a small amount of spotting this morning at 1100. She states she has no bleeding since then. Pt states decreased fetal movement today but states she feels movement now. VSS. MD notified of reactive NST.

## 2019-08-29 ENCOUNTER — Other Ambulatory Visit: Payer: Self-pay

## 2019-08-29 ENCOUNTER — Other Ambulatory Visit
Admission: RE | Admit: 2019-08-29 | Discharge: 2019-08-29 | Disposition: A | Discharge status: Home / Self Care | Payer: Medicaid Other | Source: Ambulatory Visit | Attending: Obstetrics and Gynecology | Admitting: Obstetrics and Gynecology

## 2019-08-29 DIAGNOSIS — Z20822 Contact with and (suspected) exposure to covid-19: Secondary | ICD-10-CM | POA: Diagnosis not present

## 2019-08-29 DIAGNOSIS — Z01812 Encounter for preprocedural laboratory examination: Secondary | ICD-10-CM | POA: Insufficient documentation

## 2019-08-29 LAB — SARS CORONAVIRUS 2 (TAT 6-24 HRS): SARS Coronavirus 2: NEGATIVE

## 2019-08-30 NOTE — Discharge Summary (Signed)
Physician Final Progress Note  Patient ID: Amy Duncan MRN: [id number] DOB/AGE: [birth year] 24 y.o.  Admit date: 08/27/2019 Admitting provider: Malachy Mood, MD Discharge date: 08/30/2019   Admission Diagnoses: Vaginal spotting  Discharge Diagnoses:  Active Problems:   Labor and delivery, indication for care  24 y.o. G2P0010 at [redacted]w[redacted]d by Estimated Date of Delivery: 08/26/19 presenting with one contraction yesterday evening and some spotting this morning, none since, and no evidence of bleeding per nursing staff.  Patient with reactive NST, normotensive, and no contractions on tocometer.  IOL scheduled for 08/31/2019.  SECOND Problems (from 01/29/19 to present)    Problem Noted Resolved   Maternal varicella, non-immune 05/13/2019 by Gatha Mayer, MD No   Overview Signed 05/13/2019  9:56 AM by Gatha Mayer, MD    Vaccinate pp       Supervision of other normal pregnancy, antepartum 01/29/2019 by Rexene Agent, CNM No   Overview Addendum 08/12/2019  9:34 PM by Dalia Heading, [place] Prenatal Labs  Dating LMP =12wk [place] Blood type: B/Positive/-- (07/16 1516)   Genetic Screen  NIPS: normal XX Antibody:Negative (07/16 1516)  Anatomic [place] complete Rubella: 3.64 (07/16 1516) Varicella:  NONIMMUNE  GTT Early:  WNL   Third trimester: 127 RPR: Non Reactive (11/06 1603)   Rhogam Not needed HBsAg: Negative (07/16 1516)   TDaP vaccine 06/24/2019                       Flu Shot:Declines HIV: Non Reactive (11/06 1603)   Baby Food    Breast/bottle                       GBS: negative  Contraception  undecided, used patch before Pap: N/A, < 21  CBB     CS/VBAC  not applicable   Support Person                    Consults: None  Significant Findings/ Diagnostic Studies: none  Procedures: Baseline: 135 Variability: moderate Accelerations: present Decelerations: present Tocometry: none The patient was monitored for 30 minutes, fetal heart rate  tracing was deemed reactive, category I tracing,    Discharge Condition: good  Disposition: Discharge disposition: 01-Home or Self Care       Diet: Regular diet  Discharge Activity: Activity as tolerated  Discharge Instructions    Discharge activity:  No Restrictions   Complete by: As directed    Discharge diet:  No restrictions   Complete by: As directed    Fetal Kick Count:  Lie on our left side for one hour after a meal, and count the number of times your baby kicks.  If it is less than 5 times, get up, move around and drink some juice.  Repeat the test 30 minutes later.  If it is still less than 5 kicks in an hour, notify your doctor.   Complete by: As directed    LABOR:  When conractions begin, you should start to time them from the beginning of one contraction to the beginning  of the next.  When contractions are 5 - 10 minutes apart or less and have been regular for at least an hour, you should call your health care provider.   Complete by: As directed    No sexual activity restrictions   Complete by: As directed    Notify physician for bleeding from the vagina   Complete by: As directed  Notify physician for blurring of vision or spots before the eyes   Complete by: As directed    Notify physician for chills or fever   Complete by: As directed    Notify physician for fainting spells, "black outs" or loss of consciousness   Complete by: As directed    Notify physician for increase in vaginal discharge   Complete by: As directed    Notify physician for leaking of fluid   Complete by: As directed    Notify physician for pain or burning when urinating   Complete by: As directed    Notify physician for pelvic pressure (sudden increase)   Complete by: As directed    Notify physician for severe or continued nausea or vomiting   Complete by: As directed    Notify physician for sudden gushing of fluid from the vagina (with or without continued leaking)   Complete by: As  directed    Notify physician for sudden, constant, or occasional abdominal pain   Complete by: As directed    Notify physician if baby moving less than usual   Complete by: As directed      Allergies as of 08/27/2019   No Known Allergies     Medication List    TAKE these medications   Prenatal Vitamins 0.8 MG tablet Take 1 tablet by mouth daily.        Total time spent taking care of this patient: triaged remotely  Signed: Vena Austria 08/30/2019, 9:57 PM

## 2019-08-31 ENCOUNTER — Inpatient Hospital Stay: Payer: Medicaid Other | Admitting: Registered Nurse

## 2019-08-31 ENCOUNTER — Encounter: Payer: Self-pay | Admitting: Obstetrics and Gynecology

## 2019-08-31 ENCOUNTER — Other Ambulatory Visit: Payer: Self-pay

## 2019-08-31 ENCOUNTER — Inpatient Hospital Stay
Admission: EM | Admit: 2019-08-31 | Discharge: 2019-09-02 | DRG: 787 | Disposition: A | Discharge status: Home / Self Care | Payer: Medicaid Other | Attending: Obstetrics and Gynecology | Admitting: Obstetrics and Gynecology

## 2019-08-31 ENCOUNTER — Encounter
Admission: EM | Disposition: A | Discharge status: Home / Self Care | Payer: Self-pay | Source: Home / Self Care | Attending: Obstetrics and Gynecology

## 2019-08-31 DIAGNOSIS — O99214 Obesity complicating childbirth: Secondary | ICD-10-CM | POA: Diagnosis present

## 2019-08-31 DIAGNOSIS — Z6841 Body Mass Index (BMI) 40.0 and over, adult: Secondary | ICD-10-CM

## 2019-08-31 DIAGNOSIS — O9081 Anemia of the puerperium: Secondary | ICD-10-CM | POA: Diagnosis not present

## 2019-08-31 DIAGNOSIS — D62 Acute posthemorrhagic anemia: Secondary | ICD-10-CM | POA: Diagnosis not present

## 2019-08-31 DIAGNOSIS — Z3A40 40 weeks gestation of pregnancy: Secondary | ICD-10-CM | POA: Diagnosis not present

## 2019-08-31 DIAGNOSIS — O480 Post-term pregnancy: Secondary | ICD-10-CM | POA: Diagnosis present

## 2019-08-31 DIAGNOSIS — Z2839 Other underimmunization status: Secondary | ICD-10-CM

## 2019-08-31 DIAGNOSIS — Z3480 Encounter for supervision of other normal pregnancy, unspecified trimester: Secondary | ICD-10-CM

## 2019-08-31 DIAGNOSIS — Z3490 Encounter for supervision of normal pregnancy, unspecified, unspecified trimester: Secondary | ICD-10-CM

## 2019-08-31 DIAGNOSIS — O09899 Supervision of other high risk pregnancies, unspecified trimester: Secondary | ICD-10-CM

## 2019-08-31 DIAGNOSIS — Z98891 History of uterine scar from previous surgery: Secondary | ICD-10-CM

## 2019-08-31 HISTORY — DX: Other specified health status: Z78.9

## 2019-08-31 LAB — CBC
HCT: 35.1 % — ABNORMAL LOW (ref 36.0–46.0)
Hemoglobin: 11.6 g/dL — ABNORMAL LOW (ref 12.0–15.0)
MCH: 27.1 pg (ref 26.0–34.0)
MCHC: 33 g/dL (ref 30.0–36.0)
MCV: 82 fL (ref 80.0–100.0)
Platelets: 367 10*3/uL (ref 150–400)
RBC: 4.28 MIL/uL (ref 3.87–5.11)
RDW: 14.7 % (ref 11.5–15.5)
WBC: 14.9 10*3/uL — ABNORMAL HIGH (ref 4.0–10.5)
nRBC: 0 % (ref 0.0–0.2)

## 2019-08-31 LAB — ABO/RH: ABO/RH(D): B POS

## 2019-08-31 LAB — TYPE AND SCREEN
ABO/RH(D): B POS
Antibody Screen: NEGATIVE

## 2019-08-31 SURGERY — Surgical Case
Anesthesia: Spinal

## 2019-08-31 MED ORDER — OXYTOCIN BOLUS FROM INFUSION: 500.0000 mL | Freq: Once | INTRAVENOUS | Status: DC

## 2019-08-31 MED ORDER — OXYCODONE HCL 5 MG PO TABS: 10.0000 mg | ORAL_TABLET | ORAL | Status: DC | PRN

## 2019-08-31 MED ORDER — LACTATED RINGERS IV SOLN: INTRAVENOUS | Status: DC

## 2019-08-31 MED ORDER — SOD CITRATE-CITRIC ACID 500-334 MG/5ML PO SOLN
30.0000 mL | ORAL | Status: AC
  Administered 2019-08-31: 30 mL via ORAL
  Filled 2019-08-31: qty 30

## 2019-08-31 MED ORDER — OXYCODONE-ACETAMINOPHEN 5-325 MG PO TABS: 2.0000 | ORAL_TABLET | ORAL | Status: DC | PRN

## 2019-08-31 MED ORDER — FENTANYL CITRATE (PF) 100 MCG/2ML IJ SOLN
INTRAMUSCULAR | Status: DC | PRN
  Administered 2019-08-31: 15 ug via INTRATHECAL

## 2019-08-31 MED ORDER — NALBUPHINE HCL 10 MG/ML IJ SOLN: 5.0000 mg | INTRAMUSCULAR | Status: DC | PRN

## 2019-08-31 MED ORDER — CEFAZOLIN SODIUM-DEXTROSE 2-4 GM/100ML-% IV SOLN
2.0000 g | INTRAVENOUS | Status: AC
  Administered 2019-08-31: 12:00:00 2 g via INTRAVENOUS
  Filled 2019-08-31 (×2): qty 100

## 2019-08-31 MED ORDER — SIMETHICONE 80 MG PO CHEW
80.0000 mg | CHEWABLE_TABLET | Freq: Three times a day (TID) | ORAL | Status: DC
  Administered 2019-08-31 – 2019-09-02 (×4): 80 mg via ORAL
  Filled 2019-08-31 (×5): qty 1

## 2019-08-31 MED ORDER — ONDANSETRON HCL 4 MG/2ML IJ SOLN: 4.0000 mg | Freq: Four times a day (QID) | INTRAMUSCULAR | Status: DC | PRN

## 2019-08-31 MED ORDER — KETOROLAC TROMETHAMINE 30 MG/ML IJ SOLN
30.0000 mg | Freq: Four times a day (QID) | INTRAMUSCULAR | Status: AC
  Filled 2019-08-31: qty 1

## 2019-08-31 MED ORDER — KETOROLAC TROMETHAMINE 30 MG/ML IJ SOLN
INTRAMUSCULAR | Status: AC
  Filled 2019-08-31: qty 1

## 2019-08-31 MED ORDER — OXYCODONE-ACETAMINOPHEN 5-325 MG PO TABS
1.0000 | ORAL_TABLET | ORAL | Status: DC | PRN
  Administered 2019-09-01 – 2019-09-02 (×3): 1 via ORAL
  Filled 2019-08-31 (×5): qty 1

## 2019-08-31 MED ORDER — COCONUT OIL OIL
1.0000 "application " | TOPICAL_OIL | Status: DC | PRN
  Filled 2019-08-31: qty 120

## 2019-08-31 MED ORDER — NALBUPHINE HCL 10 MG/ML IJ SOLN: 5.0000 mg | Freq: Once | INTRAMUSCULAR | Status: DC | PRN

## 2019-08-31 MED ORDER — ACETAMINOPHEN 325 MG PO TABS: 650.0000 mg | ORAL_TABLET | ORAL | Status: DC | PRN

## 2019-08-31 MED ORDER — DIPHENHYDRAMINE HCL 25 MG PO CAPS
25.0000 mg | ORAL_CAPSULE | Freq: Four times a day (QID) | ORAL | Status: DC | PRN
  Administered 2019-08-31: 25 mg via ORAL
  Filled 2019-08-31: qty 1

## 2019-08-31 MED ORDER — MENTHOL 3 MG MT LOZG
1.0000 | LOZENGE | OROMUCOSAL | Status: DC | PRN
  Filled 2019-08-31: qty 9

## 2019-08-31 MED ORDER — SCOPOLAMINE 1 MG/3DAYS TD PT72: 1.0000 | MEDICATED_PATCH | Freq: Once | TRANSDERMAL | Status: DC

## 2019-08-31 MED ORDER — SODIUM CHLORIDE 0.9 % IV SOLN
INTRAVENOUS | Status: DC | PRN
  Administered 2019-08-31: 12:00:00 50 ug/min via INTRAVENOUS

## 2019-08-31 MED ORDER — ONDANSETRON HCL 4 MG/2ML IJ SOLN
INTRAMUSCULAR | Status: AC
  Filled 2019-08-31: qty 2

## 2019-08-31 MED ORDER — BUPIVACAINE HCL (PF) 0.5 % IJ SOLN: 10.0000 mL | Freq: Once | INTRAMUSCULAR | Status: DC

## 2019-08-31 MED ORDER — VARICELLA VIRUS VACCINE LIVE 1350 PFU/0.5ML IJ SUSR
0.5000 mL | INTRAMUSCULAR | Status: DC | PRN
  Filled 2019-08-31: qty 0.5

## 2019-08-31 MED ORDER — PRENATAL MULTIVITAMIN CH
1.0000 | ORAL_TABLET | Freq: Every day | ORAL | Status: DC
  Administered 2019-09-01: 1 via ORAL
  Filled 2019-08-31: qty 1

## 2019-08-31 MED ORDER — MORPHINE SULFATE (PF) 0.5 MG/ML IJ SOLN
INTRAMUSCULAR | Status: DC | PRN
  Administered 2019-08-31: .1 mg via INTRATHECAL

## 2019-08-31 MED ORDER — OXYTOCIN 40 UNITS IN NORMAL SALINE INFUSION - SIMPLE MED
2.5000 [IU]/h | INTRAVENOUS | Status: DC
  Administered 2019-08-31: 12:00:00 40 [IU] via INTRAVENOUS
  Filled 2019-08-31: qty 1000

## 2019-08-31 MED ORDER — LIDOCAINE HCL (PF) 1 % IJ SOLN: 30.0000 mL | INTRAMUSCULAR | Status: DC | PRN

## 2019-08-31 MED ORDER — LACTATED RINGERS IV SOLN
500.0000 mL | INTRAVENOUS | Status: DC | PRN
  Administered 2019-08-31: 500 mL via INTRAVENOUS

## 2019-08-31 MED ORDER — MISOPROSTOL 25 MCG QUARTER TABLET: 25.0000 ug | ORAL_TABLET | ORAL | Status: DC | PRN

## 2019-08-31 MED ORDER — PHENYLEPHRINE HCL (PRESSORS) 10 MG/ML IV SOLN
INTRAVENOUS | Status: DC | PRN
  Administered 2019-08-31: 200 ug via INTRAVENOUS

## 2019-08-31 MED ORDER — BUTORPHANOL TARTRATE 1 MG/ML IJ SOLN: 1.0000 mg | INTRAMUSCULAR | Status: DC | PRN

## 2019-08-31 MED ORDER — MORPHINE SULFATE (PF) 0.5 MG/ML IJ SOLN
INTRAMUSCULAR | Status: AC
  Filled 2019-08-31: qty 10

## 2019-08-31 MED ORDER — TERBUTALINE SULFATE 1 MG/ML IJ SOLN
INTRAMUSCULAR | Status: AC
  Administered 2019-08-31: 0.25 mg via SUBCUTANEOUS
  Filled 2019-08-31: qty 1

## 2019-08-31 MED ORDER — DIBUCAINE (PERIANAL) 1 % EX OINT: 1.0000 "application " | TOPICAL_OINTMENT | CUTANEOUS | Status: DC | PRN

## 2019-08-31 MED ORDER — BUPIVACAINE IN DEXTROSE 0.75-8.25 % IT SOLN
INTRATHECAL | Status: DC | PRN
  Administered 2019-08-31: 1.6 mL via INTRATHECAL

## 2019-08-31 MED ORDER — SODIUM CHLORIDE 0.9% FLUSH: 3.0000 mL | INTRAVENOUS | Status: DC | PRN

## 2019-08-31 MED ORDER — MISOPROSTOL 25 MCG QUARTER TABLET
ORAL_TABLET | ORAL | Status: AC
  Administered 2019-08-31: 25 ug via VAGINAL
  Filled 2019-08-31: qty 1

## 2019-08-31 MED ORDER — BUPIVACAINE 0.25 % ON-Q PUMP DUAL CATH 400 ML
400.0000 mL | INJECTION | Status: DC
  Filled 2019-08-31: qty 400

## 2019-08-31 MED ORDER — ENOXAPARIN SODIUM 40 MG/0.4ML ~~LOC~~ SOLN
40.0000 mg | SUBCUTANEOUS | Status: DC
  Administered 2019-09-01: 40 mg via SUBCUTANEOUS
  Filled 2019-08-31: qty 0.4

## 2019-08-31 MED ORDER — OXYTOCIN 10 UNIT/ML IJ SOLN
INTRAMUSCULAR | Status: AC
  Filled 2019-08-31: qty 2

## 2019-08-31 MED ORDER — BUPIVACAINE HCL (PF) 0.5 % IJ SOLN
INTRAMUSCULAR | Status: AC
  Filled 2019-08-31: qty 30

## 2019-08-31 MED ORDER — ACETAMINOPHEN 500 MG PO TABS
1000.0000 mg | ORAL_TABLET | Freq: Four times a day (QID) | ORAL | Status: AC
  Administered 2019-08-31 – 2019-09-01 (×3): 1000 mg via ORAL
  Filled 2019-08-31 (×3): qty 2

## 2019-08-31 MED ORDER — AMMONIA AROMATIC IN INHA
RESPIRATORY_TRACT | Status: AC
  Filled 2019-08-31: qty 10

## 2019-08-31 MED ORDER — WITCH HAZEL-GLYCERIN EX PADS: 1.0000 "application " | MEDICATED_PAD | CUTANEOUS | Status: DC | PRN

## 2019-08-31 MED ORDER — BUPIVACAINE HCL 0.5 % IJ SOLN
INTRAMUSCULAR | Status: DC | PRN
  Administered 2019-08-31: 30 mL

## 2019-08-31 MED ORDER — ONDANSETRON HCL 4 MG/2ML IJ SOLN: 4.0000 mg | Freq: Three times a day (TID) | INTRAMUSCULAR | Status: DC | PRN

## 2019-08-31 MED ORDER — LIDOCAINE HCL (PF) 1 % IJ SOLN
INTRAMUSCULAR | Status: DC | PRN
  Administered 2019-08-31: 3 mL via SUBCUTANEOUS

## 2019-08-31 MED ORDER — SENNOSIDES-DOCUSATE SODIUM 8.6-50 MG PO TABS
2.0000 | ORAL_TABLET | ORAL | Status: DC
  Administered 2019-09-02: 2 via ORAL
  Filled 2019-08-31: qty 2

## 2019-08-31 MED ORDER — MISOPROSTOL 200 MCG PO TABS
ORAL_TABLET | ORAL | Status: AC
  Filled 2019-08-31: qty 4

## 2019-08-31 MED ORDER — NALOXONE HCL 4 MG/10ML IJ SOLN
1.0000 ug/kg/h | INTRAVENOUS | Status: DC | PRN
  Filled 2019-08-31: qty 5

## 2019-08-31 MED ORDER — NALOXONE HCL 0.4 MG/ML IJ SOLN: 0.4000 mg | INTRAMUSCULAR | Status: DC | PRN

## 2019-08-31 MED ORDER — ONDANSETRON HCL 4 MG/2ML IJ SOLN
INTRAMUSCULAR | Status: DC | PRN
  Administered 2019-08-31: 4 mg via INTRAVENOUS

## 2019-08-31 MED ORDER — IBUPROFEN 600 MG PO TABS
600.0000 mg | ORAL_TABLET | Freq: Four times a day (QID) | ORAL | Status: DC
  Administered 2019-09-01 – 2019-09-02 (×3): 600 mg via ORAL
  Filled 2019-08-31 (×3): qty 1

## 2019-08-31 MED ORDER — TERBUTALINE SULFATE 1 MG/ML IJ SOLN: 0.2500 mg | Freq: Once | INTRAMUSCULAR | Status: AC | PRN

## 2019-08-31 MED ORDER — OXYCODONE HCL 5 MG PO TABS: 5.0000 mg | ORAL_TABLET | ORAL | Status: DC | PRN

## 2019-08-31 MED ORDER — KETOROLAC TROMETHAMINE 30 MG/ML IJ SOLN
30.0000 mg | Freq: Four times a day (QID) | INTRAMUSCULAR | Status: AC
  Administered 2019-08-31 – 2019-09-01 (×3): 30 mg via INTRAVENOUS
  Filled 2019-08-31 (×2): qty 1

## 2019-08-31 MED ORDER — OXYTOCIN 40 UNITS IN NORMAL SALINE INFUSION - SIMPLE MED
2.5000 [IU]/h | INTRAVENOUS | Status: AC
  Administered 2019-08-31: 2.5 [IU]/h via INTRAVENOUS
  Filled 2019-08-31: qty 1000

## 2019-08-31 MED ORDER — FERROUS SULFATE 325 (65 FE) MG PO TABS
325.0000 mg | ORAL_TABLET | Freq: Two times a day (BID) | ORAL | Status: DC
  Administered 2019-09-01: 325 mg via ORAL
  Filled 2019-08-31: qty 1

## 2019-08-31 MED ORDER — FENTANYL CITRATE (PF) 100 MCG/2ML IJ SOLN
INTRAMUSCULAR | Status: AC
  Filled 2019-08-31: qty 2

## 2019-08-31 SURGICAL SUPPLY — 31 items
CANISTER SUCT 3000ML PPV (MISCELLANEOUS) ×3 IMPLANT
CATH KIT ON-Q SILVERSOAK 5IN (CATHETERS) ×6 IMPLANT
CLOSURE WOUND 1/2 X4 (GAUZE/BANDAGES/DRESSINGS) ×1
COVER WAND RF STERILE (DRAPES) ×3 IMPLANT
DERMABOND ADVANCED (GAUZE/BANDAGES/DRESSINGS) ×2
DERMABOND ADVANCED .7 DNX12 (GAUZE/BANDAGES/DRESSINGS) ×1 IMPLANT
DRSG OPSITE POSTOP 4X10 (GAUZE/BANDAGES/DRESSINGS) ×3 IMPLANT
DRSG TELFA 3X8 NADH (GAUZE/BANDAGES/DRESSINGS) ×3 IMPLANT
ELECT CAUTERY BLADE 6.4 (BLADE) ×3 IMPLANT
ELECT REM PT RETURN 9FT ADLT (ELECTROSURGICAL) ×3
ELECTRODE REM PT RTRN 9FT ADLT (ELECTROSURGICAL) ×1 IMPLANT
GAUZE SPONGE 4X4 12PLY STRL (GAUZE/BANDAGES/DRESSINGS) ×3 IMPLANT
GLOVE BIO SURGEON STRL SZ7 (GLOVE) ×3 IMPLANT
GLOVE INDICATOR 7.5 STRL GRN (GLOVE) ×3 IMPLANT
GOWN STRL REUS W/ TWL LRG LVL3 (GOWN DISPOSABLE) ×3 IMPLANT
GOWN STRL REUS W/TWL LRG LVL3 (GOWN DISPOSABLE) ×6
NS IRRIG 1000ML POUR BTL (IV SOLUTION) ×3 IMPLANT
PACK C SECTION AR (MISCELLANEOUS) ×3 IMPLANT
PAD OB MATERNITY 4.3X12.25 (PERSONAL CARE ITEMS) ×6 IMPLANT
PAD PREP 24X41 OB/GYN DISP (PERSONAL CARE ITEMS) ×3 IMPLANT
PENCIL SMOKE ULTRAEVAC 22 CON (MISCELLANEOUS) ×3 IMPLANT
STRIP CLOSURE SKIN 1/2X4 (GAUZE/BANDAGES/DRESSINGS) ×2 IMPLANT
SUT MNCRL 4-0 (SUTURE) ×2
SUT MNCRL 4-0 27XMFL (SUTURE) ×1
SUT PDS AB 1 TP1 96 (SUTURE) ×3 IMPLANT
SUT PLAIN 2 0 XLH (SUTURE) ×3 IMPLANT
SUT PLAIN GUT 0 (SUTURE) IMPLANT
SUT VIC AB 0 CTX 36 (SUTURE) ×6
SUT VIC AB 0 CTX36XBRD ANBCTRL (SUTURE) ×3 IMPLANT
SUTURE MNCRL 4-0 27XMF (SUTURE) ×1 IMPLANT
SWABSTK COMLB BENZOIN TINCTURE (MISCELLANEOUS) ×3 IMPLANT

## 2019-08-31 NOTE — Anesthesia Procedure Notes (Signed)
Spinal  Patient location during procedure: OR Start time: 08/31/2019 11:40 AM End time: 08/31/2019 11:45 AM Staffing Performed: resident/CRNA  Anesthesiologist: Arita Miss, MD Resident/CRNA: Hedda Slade, CRNA Preanesthetic Checklist Completed: patient identified, IV checked, site marked, risks and benefits discussed, surgical consent, monitors and equipment checked, pre-op evaluation and timeout performed Spinal Block Patient position: sitting Prep: ChloraPrep Patient monitoring: heart rate, continuous pulse ox, blood pressure and cardiac monitor Approach: midline Location: L3-4 Injection technique: single-shot Needle Needle type: Whitacre and Introducer  Needle gauge: 24 G Needle length: 9 cm Assessment Sensory level: T4 Additional Notes Negative paresthesia. Negative blood return. Positive free-flowing CSF. Expiration date of kit checked and confirmed. Patient tolerated procedure well, without complications.

## 2019-08-31 NOTE — Discharge Summary (Signed)
OB Discharge Summary     Patient Name: Amy Duncan DOB: February 19, 1996 MRN: [id number]  Date of admission: 08/31/2019 Delivering MD: Prentice Docker, MD  Date of Delivery: 08/31/2019  Date of discharge: 09/02/2019  Admitting diagnosis: Labor and delivery, indication for care [O75.9] Intrauterine pregnancy: [redacted]w[redacted]d     Secondary diagnosis: None     Discharge diagnosis: Term Pregnancy Delivered                                                                                                Post partum procedures:none  Augmentation: Cytotec  Complications: None  Hospital course:  Induction of Labor With Cesarean Section  24 y.o. yo G2P1011 at [redacted]w[redacted]d was admitted to the hospital 08/31/2019 for induction of labor. Patient had a labor course significant for fetal intolerance of labor with minimal uterine contractions. The patient went for cesarean section due to fetal intolerance of labor with intolerance of minimal contractions, and delivered a Viable infant,08/31/2019  Membrane Rupture Time/Date: 12:09 PM ,08/31/2019   Details of operation can be found in separate operative Note.  Patient had an uncomplicated postpartum course. She is ambulating, tolerating a regular diet, passing flatus, and urinating well.  Patient is discharged home in stable condition on 09/02/19.                                    Physical exam  Vitals:   09/01/19 1247 09/01/19 1630 09/02/19 0002 09/02/19 0750  BP: (!) 99/57 109/65 (!) 102/55 107/78  Pulse: 75 65 90 70  Resp: 18 18 20 18   Temp: 97.6 F (36.4 C) 98.4 F (36.9 C) 98.6 F (37 C) 97.6 F (36.4 C)  TempSrc: Oral Oral Oral Oral  SpO2: 97% 100% 100% 98%  Weight:      Height:       General: alert, cooperative and no distress Lochia: appropriate Uterine Fundus: firm Incision: Healing well with no significant drainage DVT Evaluation: No evidence of DVT seen on physical exam.  Labs: Lab Results  Component Value Date   WBC 15.0 (H) 09/01/2019   HGB 9.7 (L)  09/01/2019   HCT 30.7 (L) 09/01/2019   MCV 83.7 09/01/2019   PLT 287 09/01/2019    Discharge instruction: per After Visit Summary.  Medications:  Allergies as of 09/02/2019   No Known Allergies     Medication List    TAKE these medications   docusate sodium 100 MG capsule Commonly known as: Colace Take 1 capsule (100 mg total) by mouth daily as needed.   ferrous sulfate 325 (65 FE) MG tablet Take 1 tablet (325 mg total) by mouth daily with breakfast. Start taking on: September 03, 2019   ibuprofen 600 MG tablet Commonly known as: ADVIL Take 1 tablet (600 mg total) by mouth every 6 (six) hours.   oxyCODONE-acetaminophen 5-325 MG tablet Commonly known as: PERCOCET/ROXICET Take 1 tablet by mouth every 6 (six) hours as needed for moderate pain (pain score 4-7/10).   Prenatal Vitamins 0.8 MG tablet Take 1 tablet by  mouth daily.            Discharge Care Instructions  (From admission, onward)         Start     Ordered   09/02/19 0000  Discharge wound care:    Comments: SHOWER DAILY Wash incision gently with soap and water.  Call office with any drainage, redness, or firmness of the incision.   09/02/19 1023          Diet: routine diet  Activity: Advance as tolerated. Pelvic rest for 6 weeks.   Outpatient follow up: Follow-up Information    Conard Novak, MD. Schedule an appointment as soon as possible for a visit in 2 week(s).   Specialty: Obstetrics and Gynecology Why: Post-op Incision check Contact information: [address] [place] [address] [phone]             Postpartum contraception: IUD Mirena Rhogam Given postpartum: no Rubella vaccine given postpartum: no Varicella vaccine given postpartum: yes TDaP given antepartum or postpartum: 06/24/2019  Newborn Data: Live born female "Arianna" Birth Weight: 7 lb 2.6 oz (3,250 g) APGAR: 9, 9  Newborn Delivery   Birth date/time: 08/31/2019 12:10:00 Delivery type: C-Section,  Low Transverse Trial of labor: Yes C-section categorization: Primary     Baby Feeding: Bottle and Breast  Disposition:home with mother  SIGNED: Adelene Idler MD [facility], [facility] 09/02/2019 10:23 AM

## 2019-08-31 NOTE — Progress Notes (Signed)
Date of Initial H&P: 08/23/2019  History reviewed, patient examined, agree with previous H&P with the following changes:  Amy Duncan is a 24 y.o. G66P0010 female with EDC=09/05/2019 at 40wk5d dated by LMP.  Her pregnancy has been without complication until the last couple of weeks when her BMI reached 40. She is varicella non-immune. TWG 22# Her pregnancy has also been remarkable for the following: Clinic [facility] Prenatal Labs  Dating LMP =12wk [place] Blood type: B/Positive/-- (07/16 1516)   Genetic Screen  NIPS: normal XX Antibody:Negative (07/16 1516)  Anatomic [place] complete Rubella: 3.64 (07/16 1516) Varicella:  NONIMMUNE  GTT Early:  WNL   Third trimester: 127 RPR: Non Reactive (11/06 1603)   Rhogam Not needed HBsAg: Negative (07/16 1516)   TDaP vaccine 06/24/2019                       Flu Shot:Declines HIV: Non Reactive (11/06 1603)   Baby Food    Breast/bottle                       GBS: negative  Contraception  undecided, used patch before Pap: N/A, < 21  CBB     CS/VBAC  not applicable   Support Person         Exam: General: gravid BF in NAD Vital signs: BP 117/68 (BP Location: Left Arm)   Pulse 86   Temp 99 F (37.2 C) (Oral)   Resp 14   Ht 4\' 11"  (1.499 m)   Wt 90.7 kg   LMP 11/19/2018 (Exact Date)   BMI 40.40 kg/m   Abdomen: cephalic, EFW 8#. 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 Explained to patient methods used in induction of labor and risks including hyperstimulation, fetal intolerance, failed induction, failure to progress and Cesarean section.  25 mcg Cytotec inserted B POS/ RI/ VNI-offer Varivax pp Breast and bottle TDAP UTD GBS negative Contraception: undecided  11/21/2018, CNM

## 2019-08-31 NOTE — Op Note (Signed)
Cesarean Section Operative Note    Amy Duncan   08/31/2019   Pre-operative Diagnosis:  1) Fetal intolerance of labor 2) intrauterine pregnancy at [redacted]w[redacted]d   Post-operative Diagnosis:  1) Fetal intolerance of labor 2) intrauterine pregnancy at [redacted]w[redacted]d   Procedure: Primary Low Transverse Cesarean Section via Pfannenstiel incision with double layer uterine closure  Surgeon: Surgeon(s) and Role:    Conard Novak, MD - Primary   Assistants: Farrel Conners, CNM; No other capable assistant available, in surgery requiring high level assistant.  Anesthesia: spinal   Findings:  1) normal appearing gravid uterus, fallopian tubes, and ovaries 2) viable female infant with weight 3,250 grams, APGARs 9 and 9   Quantified Blood Loss: 765 mL  Total IV Fluids: 700 ml   Urine Output: 125 mL clear urine at end of case  Specimens: none  Complications: no complications  Disposition: PACU - hemodynamically stable.   Maternal Condition: stable   Baby condition / location:  Couplet care / Skin to Skin  Procedure Details:  The patient was seen in the Holding Room. The risks, benefits, complications, treatment options, and expected outcomes were discussed with the patient. The patient concurred with the proposed plan, giving informed consent. identified as Amy Duncan and the procedure verified as C-Section Delivery. A Time Out was held and the above information confirmed.   After induction of anesthesia, the patient was draped and prepped in the usual sterile manner. A Pfannenstiel incision was made and carried down through the subcutaneous tissue to the fascia. Fascial incision was made and extended transversely. The fascia was separated from the underlying rectus tissue superiorly and inferiorly. The peritoneum was identified and entered. Peritoneal incision was extended longitudinally. The bladder flap was not bluntly or sharply freed from the lower uterine segment. A low  transverse uterine incision was made and the hysterotomy was extended with cranial-caudal tension. Delivered from cephalic presentation was a 3,250 gram Living newborn infant(s) or Female with Apgar scores of 9 at one minute and 9 at five minutes. Cord ph was not sent the umbilical cord was clamped and cut cord blood was not obtained for evaluation. The placenta was removed Intact and appeared normal. The uterine outline, tubes and ovaries appeared normal. The uterine incision was closed with running locked sutures of 0 Vicryl.  A second layer of the same suture was thrown in an imbricating fashion.  Hemostasis was assured.  The uterus was returned to the abdomen and the paracolic gutters were cleared of all clots and debris.  The rectus muscles were inspected and found to be hemostatic.  The On-Q catheter pumps were inserted in accordance with the manufacturer's recommendations.  The catheters were inserted approximately 4cm cephelad to the incision line, approximately 1cm apart, straddling the midline.  They were inserted to a depth of the 4th mark. They were positioned superficial to the rectus abdominus muscles and deep to the rectus fascia.    The fascia was then reapproximated with running sutures of 1-0 PDS, looped. The subcutaneous tissue was reapproximated using 2-0 plain gut such that no greater than 2cm of dead space remained. The subcuticular closure was performed using 4-0 monocryl. The skin closure was reinforced using surgical skin glue.  The On-Q catheters were bolused with 5 mL of 0.5% marcaine plain for a total of 10 mL.  The catheters were affixed to the skin with surgical skin glue, steri-strips, and tegaderm.    The assistant surgeon performed tissue retraction, hemostasis, suturing assistance,  fundal pressure.  Instrument, sponge, and needle counts were correct prior the abdominal closure and were correct at the conclusion of the case.  The patient received Ancef 2 gram IV prior to  skin incision (within 30 minutes). For VTE prophylaxis she was wearing SCDs throughout the case.  The assistant surgeon was an MD due to lack of availability of another Counselling psychologist.   Signed: Will Bonnet, MD 08/31/2019 12:57 PM

## 2019-08-31 NOTE — Progress Notes (Signed)
RN assumed care of patient at 11:05 a.m.

## 2019-08-31 NOTE — Transfer of Care (Signed)
Immediate Anesthesia Transfer of Care Note  Patient: Amy Duncan  Procedure(s) Performed: CESAREAN SECTION (N/A )  Patient Location: PACU  Anesthesia Type:General  Level of Consciousness: awake, alert, and oriented  Airway & Oxygen Therapy: Patient Spontanous Breathing  Post-op Assessment: Report given to RN and Post -op Vital signs reviewed and stable  Post vital signs: Reviewed  Last Vitals:  Vitals Value Taken Time  BP 99/68 08/31/19 1307  Temp    Pulse 84 08/31/19 1307  Resp 22 08/31/19 1307  SpO2 99 % 08/31/19 1307    Last Pain:  Vitals:   08/31/19 1307  TempSrc:   PainSc: 0-No pain         Complications: No apparent anesthesia complications

## 2019-08-31 NOTE — Progress Notes (Addendum)
L&D Progress Note   CTSP for FHR deceleration to 40 BPM with slow return to baseline after position changes, O2, IV fluid bolus and Trendelenburg. Had another deceleration below 60 BPM x 2 minutes a few minutes later. Both decelerations occurred with mild contractions. Terbutaline was given [place]. Fetal heart rate now 150 BPM with moderate variability. Dr Jean Rosenthal notified and on his way to the hospital. Discussed Cesarean section with patient. Explained risks of bleeding, infection, and injury to other organs inside abdomen. Has already signed blood transfusion consent in case of need during surgery. Written consent for Cesarean section obtained from [facility], CNM

## 2019-08-31 NOTE — Progress Notes (Signed)
Called to see patient by CNM due to late decelerations after a mild contraction (not really felt by patient). The deceleration went from a baseline of about 150 bpm to the 40s at the nadir.  The fetal heart rate was in the 40's for an indeterminate amount of time, but could have been 2-3 minutes.  The heart rate began increasing. However, with another mild contraction the heart rate dropped again to below the 90s, as far as the tracing is able to indicate.  The initial drop in fetal heart rate started at around 1023 and came back to nearly baseline by 1027.  Another decelerations went from 1029 until 1031.  With resolution of the contract the fetal heart rate return to baseline with moderate variability. She received a dose of terbutaline at 1035.  Since that time the fetal tracing has been category 1 with the acception of a variable contractions to the 100s that was short lived.   Discussed that she is very remote from delivery. She has only had a few contractions total.  We discussed that she is remote from delivery and that given a mild contraction caused a severe deceleration (very low heart rate), that the labor process will require a large number of much stronger and more closely spaced contractions.  Decision was made to proceed with cesarean section due to fetus's inability to tolerate labor.  She readily agreed. I personally consented the patient for a c-section, explaining the risks of bleeding, infection, damage to nearby organs (bowel, bladder, nerves, blood vessels), risks of anesthesia.  Patient voiced understanding and agreement to proceed with abdominal delivery.  I discussed the case directly with the anesthesiologist as being urgent, given that contractions are being held off by terbutaline for only a short time (~45 minutes).   All orders placed.   Thomasene Mohair, MD, [facility], [facility] 08/31/2019 11:18 AM

## 2019-08-31 NOTE — Anesthesia Preprocedure Evaluation (Signed)
Anesthesia Evaluation  Patient identified by MRN, date of birth, ID band Patient awake  General Assessment Comment:Patient last ate half a sausage biscuit at 730am.  Reviewed: Allergy & Precautions, NPO status , Patient's Chart, lab work & pertinent test results  History of Anesthesia Complications Negative for: history of anesthetic complications  Airway Mallampati: III  TM Distance: >3 FB Neck ROM: Full    Dental no notable dental hx. (+) Teeth Intact   Pulmonary neg pulmonary ROS, neg sleep apnea, neg COPD, Patient abstained from smoking.Not current smoker,    Pulmonary exam normal breath sounds clear to auscultation       Cardiovascular Exercise Tolerance: Good METS(-) hypertension(-) CAD and (-) Past MI negative cardio ROS  (-) dysrhythmias  Rhythm:Regular Rate:Normal - Systolic murmurs    Neuro/Psych negative neurological ROS  negative psych ROS   GI/Hepatic neg GERD  ,(+)     (-) substance abuse  ,   Endo/Other  neg diabetesMorbid obesity  Renal/GU negative Renal ROS     Musculoskeletal   Abdominal   Peds  Hematology   Anesthesia Other Findings Past Medical History: No date: Medical history non-contributory  Reproductive/Obstetrics                             Anesthesia Physical Anesthesia Plan  ASA: III  Anesthesia Plan: Spinal   Post-op Pain Management:    Induction:   PONV Risk Score and Plan: 4 or greater and Ondansetron and Dexamethasone  Airway Management Planned: Natural Airway  Additional Equipment:   Intra-op Plan:   Post-operative Plan:   Informed Consent: I have reviewed the patients History and Physical, chart, labs and discussed the procedure including the risks, benefits and alternatives for the proposed anesthesia with the patient or authorized representative who has indicated his/her understanding and acceptance.       Plan Discussed with:  CRNA and Surgeon  Anesthesia Plan Comments: (Discussed R/B/A of neuraxial anesthesia technique with patient: - rare risks of spinal/epidural hematoma, nerve damage, infection - Risk of PDPH - Risk of itching - Risk of nausea and vomiting - Risk of conversion to general anesthesia - Risk of surgical bleeding requiring blood products - discussed increased risk of aspiration given NPO status. Surgeon is calling this c/s urgent. Discussed possibility of intubation. Patient understands.)        Anesthesia Quick Evaluation

## 2019-09-01 LAB — CBC
HCT: 30.7 % — ABNORMAL LOW (ref 36.0–46.0)
Hemoglobin: 9.7 g/dL — ABNORMAL LOW (ref 12.0–15.0)
MCH: 26.4 pg (ref 26.0–34.0)
MCHC: 31.6 g/dL (ref 30.0–36.0)
MCV: 83.7 fL (ref 80.0–100.0)
Platelets: 287 10*3/uL (ref 150–400)
RBC: 3.67 MIL/uL — ABNORMAL LOW (ref 3.87–5.11)
RDW: 14.9 % (ref 11.5–15.5)
WBC: 15 10*3/uL — ABNORMAL HIGH (ref 4.0–10.5)
nRBC: 0 % (ref 0.0–0.2)

## 2019-09-01 LAB — CREATININE, SERUM
Creatinine, Ser: 0.6 mg/dL (ref 0.44–1.00)
GFR calc Af Amer: >60 mL/min (ref >60)
GFR calc non Af Amer: >60 mL/min (ref >60)

## 2019-09-01 LAB — RPR: RPR Ser Ql: NONREACTIVE

## 2019-09-01 MED ORDER — OXYCODONE-ACETAMINOPHEN 5-325 MG PO TABS
1.0000 | ORAL_TABLET | Freq: Once | ORAL | Status: AC
  Administered 2019-09-01: 1 via ORAL
  Filled 2019-09-01: qty 1

## 2019-09-01 MED ORDER — FERROUS SULFATE 325 (65 FE) MG PO TABS
325.0000 mg | ORAL_TABLET | Freq: Every day | ORAL | Status: DC
  Administered 2019-09-02: 08:00:00 325 mg via ORAL
  Filled 2019-09-01: qty 1

## 2019-09-01 NOTE — Lactation Note (Signed)
This note was copied from a baby's chart. Lactation Consultation Note  Patient Name: Girl Regine Christian [id number] Date: 09/01/2019 Reason for consult: Follow-up assessment;Mother's request;Primapara;Term;Nipple pain/trauma;Other (Comment)(Not staying awake to BF as long since giving formula)  Mom chose to give bottle of formula this am stating she initially, on admission, wanted to do breast and bottle.  Discussed with mom possible risks of giving bottles of formula too early to success of breast feeding.  Mom still wanted to try stating Joanne Gavel was not happy unless she was sucking on the breast continually and her nipples were really getting sore.  Explained cluster feeding, size of newborn stomach and supply and demand.  After mom gave Adrianna bottle of formula, she had more difficulty getting her to latch on her own and she was nursing for shorter intervals coming on and off the breast fussing.  Demonstrated how to massage breast and gently stimulate Adrianna to keep her sucking for longer interval.  Mom has been given coconut oil and comfort gels for sore nipples and instructed in rotating use.  Mom requested nipple shield to help with tenderness and to see if she would stay latched better and longer.  Mom seemed to think the #20 nipple shield helped some with the discomfort.  Mom requested pump.  Symphony DEBP set up in room with instructions in pumping, collection, storage, cleaning, labeling and handling of expressed colostrum.  Just as we were beginning to pump, FOB brought in dinner, so mom delayed pumping until later.  Lactation name and number written on white board and encouraged to call with any questions, concerns or assistance.   Maternal Data Formula Feeding for Exclusion: No Reason for exclusion: Mother's choice to formula and breast feed on admission Has patient been taught Hand Expression?: Yes Does the patient have breastfeeding experience prior to this delivery?: No(FOB has almost 47  year old daughter)  Feeding Feeding Type: Breast Fed  LATCH Score Latch: Repeated attempts needed to sustain latch, nipple held in mouth throughout feeding, stimulation needed to elicit sucking reflex.  Audible Swallowing: A few with stimulation  Type of Nipple: Everted at rest and after stimulation  Comfort (Breast/Nipple): Filling, red/small blisters or bruises, mild/mod discomfort  Hold (Positioning): Assistance needed to correctly position infant at breast and maintain latch.  LATCH Score: 6  Interventions Interventions: Assisted with latch;Breast massage;Hand express;Breast compression;Adjust position;Support pillows;Position options;Coconut oil;Comfort gels;DEBP  Lactation Tools Discussed/Used Tools: Pump;Coconut oil;Comfort gels;Nipple Shields Nipple shield size: 20 Breast pump type: Double-Electric Breast Pump(Symphony set up in room) [facility] Program: Yes Pump Review: Setup, frequency, and cleaning;Milk Storage;Other (comment) Initiated by:: S.Tiffanee Mcnee,RN,BSN,IBCLC Date initiated:: 09/01/19   Consult Status Consult Status: Follow-up Follow-up type: Call as needed    Louis Meckel 09/01/2019, 9:35 PM

## 2019-09-01 NOTE — Progress Notes (Signed)
POD #1 s/p LTCS for FITL Subjective:   Doing well. Tolerating regular diet. Starting to pass flatus. Is breast and bottle feeding. Bleeding slowing. Voiding without difficulty  Objective:  Blood pressure (!) 99/57, pulse 75, temperature 97.6 F (36.4 C), temperature source Oral, resp. rate 18, height 4\' 11"  (1.499 m), weight 90.7 kg, last menstrual period 11/19/2018, SpO2 97 %, unknown if currently breastfeeding.  General: BF in  NAD Heart: RRR without murmur Pulmonary: no increased work of breathing/ CTAB Abdomen: soft, but tympanic upper abdomen.  Fundus firm at level of umbilicus-1`/ ML/NT. Bowel sounds active x 4  Incision: Dressing C+D+I. ON Q intact Extremities: no edema, no erythema, no tenderness  Results for orders placed or performed during the hospital encounter of 08/31/19 (from the past 72 hour(s))  CBC     Status: Abnormal   Collection Time: 08/31/19  9:05 AM  Result Value Ref Range   WBC 14.9 (H) 4.0 - 10.5 K/uL   RBC 4.28 3.87 - 5.11 MIL/uL   Hemoglobin 11.6 (L) 12.0 - 15.0 g/dL   HCT 35.1 (L) 36.0 - 46.0 %   MCV 82.0 80.0 - 100.0 fL   MCH 27.1 26.0 - 34.0 pg   MCHC 33.0 30.0 - 36.0 g/dL   RDW 14.7 11.5 - 15.5 %   Platelets 367 150 - 400 K/uL   nRBC 0.0 0.0 - 0.2 %    Comment: Performed at [facility], [address] [place], [place] [address]  RPR     Status: None   Collection Time: 08/31/19  9:05 AM  Result Value Ref Range   RPR Ser Ql NON REACTIVE NON REACTIVE    Comment: Performed at [place] [address] [place], [place] [address]  Type and screen     Status: None   Collection Time: 08/31/19  9:05 AM  Result Value Ref Range   ABO/RH(D) B POS    Antibody Screen NEG    Sample Expiration      09/03/2019,2359 Performed at [facility], [address] [place], [place] [address]   ABO/Rh     Status: None   Collection Time: 08/31/19 10:04 AM  Result Value Ref Range   ABO/RH(D)      B POS Performed at [facility], [address] [place], [place] [address]   CBC     Status: Abnormal   Collection Time: 09/01/19  7:26 AM  Result Value Ref Range   WBC 15.0 (H) 4.0 - 10.5 K/uL   RBC 3.67 (L) 3.87 - 5.11 MIL/uL   Hemoglobin 9.7 (L) 12.0 - 15.0 g/dL   HCT 30.7 (L) 36.0 - 46.0 %   MCV 83.7 80.0 - 100.0 fL   MCH 26.4 26.0 - 34.0 pg   MCHC 31.6 30.0 - 36.0 g/dL   RDW 14.9 11.5 - 15.5 %   Platelets 287 150 - 400 K/uL   nRBC 0.0 0.0 - 0.2 %    Comment: Performed at [facility], [address] [place], [place] [address]  Creatinine, serum     Status: None   Collection Time: 09/01/19  7:26 AM  Result Value Ref Range   Creatinine, Ser 0.60 0.44 - 1.00 mg/dL   GFR calc non Af Amer >60 >60 mL/min   GFR calc Af Amer >60 >60 mL/min    Comment: Performed at [facility], [address], [place], [place] [address]     Assessment:   24 y.o.  [id number] postoperativeday # 1-stable  Continue postpartum/ postoperative care  Encourage ambulation   Plan:  1) Acute blood loss anemia - hemodynamically stable and asymptomatic - po ferrous sulfate/ vitamins  2) B POS/ RI/ VNI-offer Varivax at discharge  3) TDAP-given AP  4) Breast&Bottle/Contraception-undecided  5) Disposition-possible discharge on POD #2 or POD #3  Farrel Conners, CNM

## 2019-09-01 NOTE — Anesthesia Postprocedure Evaluation (Signed)
Anesthesia Post Note  Patient: Amy Duncan  Procedure(s) Performed: CESAREAN SECTION (N/A )  Patient location during evaluation: Mother Baby Anesthesia Type: Spinal Level of consciousness: oriented and awake and alert Pain management: pain level controlled Respiratory status: spontaneous breathing and respiratory function stable Cardiovascular status: stable Postop Assessment: no headache, no backache, no apparent nausea or vomiting and able to ambulate Anesthetic complications: no     Last Vitals:  Vitals:   09/01/19 0500 09/01/19 0755  BP:  (!) 107/58  Pulse: 71 71  Resp:  20  Temp:  36.8 C  SpO2: 94% 100%    Last Pain:  Vitals:   09/01/19 0755  TempSrc: Oral  PainSc:                  Karleen Hampshire

## 2019-09-02 ENCOUNTER — Ambulatory Visit: Payer: Self-pay

## 2019-09-02 MED ORDER — DOCUSATE SODIUM 100 MG PO CAPS: 100.0000 mg | ORAL_CAPSULE | Freq: Every day | ORAL | 2 refills | Status: AC | PRN

## 2019-09-02 MED ORDER — IBUPROFEN 600 MG PO TABS: 600.0000 mg | ORAL_TABLET | Freq: Four times a day (QID) | ORAL | 1 refills | Status: DC

## 2019-09-02 MED ORDER — OXYCODONE-ACETAMINOPHEN 5-325 MG PO TABS: 1.0000 | ORAL_TABLET | Freq: Four times a day (QID) | ORAL | 0 refills | Status: DC | PRN

## 2019-09-02 MED ORDER — FERROUS SULFATE 325 (65 FE) MG PO TABS: 325.0000 mg | ORAL_TABLET | Freq: Every day | ORAL | 3 refills | Status: DC

## 2019-09-02 NOTE — Discharge Instructions (Signed)

## 2019-09-02 NOTE — Progress Notes (Signed)
Patient discharged home with infant. Discharge instructions and prescriptions given and reviewed with patient. Patient verbalized understanding. Incision cleaning kit provided. Pt refused varicella vaccine. F/u appointment made for 2 wk post op. Will be escorted out by staff.

## 2019-09-02 NOTE — Progress Notes (Signed)
Subjective:   She is doing well. Moderate pain control. Breast and bottle feeding. + flatus. normal voiding. Normal lochia. Desires discharge home today.   Objective:  Blood pressure 107/78, pulse 70, temperature 97.6 F (36.4 C), temperature source Oral, resp. rate 18, height 4\' 11"  (1.499 m), weight 90.7 kg, last menstrual period 11/19/2018, SpO2 98 %, unknown if currently breastfeeding.  General: NAD Pulmonary: no increased work of breathing Abdomen: non-distended, non-tender, fundus firm at level of umbilicus Incision: intact, clean, dry Extremities: no edema, no erythema, no tenderness  Results for orders placed or performed during the hospital encounter of 08/31/19 (from the past 72 hour(s))  CBC     Status: Abnormal   Collection Time: 08/31/19  9:05 AM  Result Value Ref Range   WBC 14.9 (H) 4.0 - 10.5 K/uL   RBC 4.28 3.87 - 5.11 MIL/uL   Hemoglobin 11.6 (L) 12.0 - 15.0 g/dL   HCT 35.1 (L) 36.0 - 46.0 %   MCV 82.0 80.0 - 100.0 fL   MCH 27.1 26.0 - 34.0 pg   MCHC 33.0 30.0 - 36.0 g/dL   RDW 14.7 11.5 - 15.5 %   Platelets 367 150 - 400 K/uL   nRBC 0.0 0.0 - 0.2 %    Comment: Performed at [facility], [address] [place], [place] [address]  RPR     Status: None   Collection Time: 08/31/19  9:05 AM  Result Value Ref Range   RPR Ser Ql NON REACTIVE NON REACTIVE    Comment: Performed at [facility], [address], [place], [place] [address]  Type and screen     Status: None   Collection Time: 08/31/19  9:05 AM  Result Value Ref Range   ABO/RH(D) B POS    Antibody Screen NEG    Sample Expiration      09/03/2019,2359 Performed at [facility], [address] [place], [place] [address]   ABO/Rh     Status: None   Collection Time: 08/31/19 10:04 AM  Result Value Ref Range   ABO/RH(D)      B POS Performed at [facility], [address] [place], [place] [address]   CBC     Status: Abnormal   Collection Time: 09/01/19  7:26  AM  Result Value Ref Range   WBC 15.0 (H) 4.0 - 10.5 K/uL   RBC 3.67 (L) 3.87 - 5.11 MIL/uL   Hemoglobin 9.7 (L) 12.0 - 15.0 g/dL   HCT 30.7 (L) 36.0 - 46.0 %   MCV 83.7 80.0 - 100.0 fL   MCH 26.4 26.0 - 34.0 pg   MCHC 31.6 30.0 - 36.0 g/dL   RDW 14.9 11.5 - 15.5 %   Platelets 287 150 - 400 K/uL   nRBC 0.0 0.0 - 0.2 %    Comment: Performed at [facility], [address] [place], [place] [address]  Creatinine, serum     Status: None   Collection Time: 09/01/19  7:26 AM  Result Value Ref Range   Creatinine, Ser 0.60 0.44 - 1.00 mg/dL   GFR calc non Af Amer >60 >60 mL/min   GFR calc Af Amer >60 >60 mL/min    Comment: Performed at [facility], [address] [place], [place] [address]     Assessment:   24 y.o. [id number] postoperativeday # 2   Plan:  1) Acute blood loss anemia - hemodynamically stable and asymptomatic - po ferrous sulfate - iron rich  diet  2) Blood Type --/--/B POS Performed at [facility], [address] [place], [place] [address]  [phone]) / Rubella 3.64 (07/16 1516) / Varicella non immune- recommended varivax vaccine before discharge  3) TDAP: up to date  4) Breast and Bottle  5) Contraception- Desires Mirena IUD  6) Disposition- desires discharge home today  Adelene Idler MD [facility], [facility] 09/02/2019 10:08 AM

## 2019-09-02 NOTE — Lactation Note (Signed)
This note was copied from a baby's chart. Lactation Consultation Note  Patient Name: Girl Maryjayne Kleven ZRAQT'M Date: 09/02/2019   Mom had been giving bottles of formula all night.  Mom insists she still wants to breast feed and plans to do it more when she gets home, nipples are not so sore and mature milk comes in.  She reports that she is sometimes putting Arianna to the breast before she gives a bottle, but falls asleep.  Explained the more bottles that she gave, the less interest Arianna may have to breast feed.  Reviewed supply and demand again stressing need for frequent stimulation of the breast by nursing or pumping to bring in mature milk and ensure a plentiful milk supply.  Mom denies any breast fullness and breasts were not hard on palpation.  Mom says she has a purple manual pump at home but does not know the name of it.  She wanted to know how to get DEBP through [facility].  Explained loaner WIC pumps were based on need to pump and availability of pumps.  Faxed referral for DEBP to [facility].  Lactation community resource hand out with contact numbers given, reviewed and encouraged mom to call with any questions, concerns or assistance.      Maternal Data    Feeding    LATCH Score                   Interventions    Lactation Tools Discussed/Used     Consult Status      Louis Meckel 09/02/2019, 9:32 PM

## 2019-09-18 ENCOUNTER — Ambulatory Visit (INDEPENDENT_AMBULATORY_CARE_PROVIDER_SITE_OTHER): Payer: Medicaid Other | Admitting: Obstetrics and Gynecology

## 2019-09-18 ENCOUNTER — Other Ambulatory Visit: Payer: Self-pay

## 2019-09-18 ENCOUNTER — Telehealth: Payer: Self-pay | Admitting: Obstetrics and Gynecology

## 2019-09-18 ENCOUNTER — Encounter: Payer: Self-pay | Admitting: Obstetrics and Gynecology

## 2019-09-18 VITALS — BP 126/84 | Ht 59.0 in | Wt 190.0 lb

## 2019-09-18 DIAGNOSIS — Z98891 History of uterine scar from previous surgery: Secondary | ICD-10-CM

## 2019-09-18 DIAGNOSIS — Z09 Encounter for follow-up examination after completed treatment for conditions other than malignant neoplasm: Secondary | ICD-10-CM

## 2019-09-18 NOTE — Telephone Encounter (Signed)
3/29 AT 230 FOR MIRENA WITH SDJ

## 2019-09-18 NOTE — Progress Notes (Signed)
   Postoperative Follow-up Patient presents post op from cesarean section  2 weeks ago.  Subjective: She denies fever, chills, nausea and vomiting. Eating a regular diet without difficulty. The patient is not having any pain.  Activity: increasing slowly. She denies issues with her incision.    Objective: BP 126/84   Ht 4\' 11"  (1.499 m)   Wt 190 lb (86.2 kg)   BMI 38.38 kg/m   Constitutional: Well nourished, well developed female in no acute distress.  HEENT: normal Skin: Warm and dry.  Abdomen: s, nt, nd, uterine fundus at U-4 clean, dry, intact and without erythema, induration, warmth, and tenderness Extremity: no edema   Assessment: 24 y.o. s/p cesarean section progressing well  Plan: Patient has done well after surgery with no apparent complications.  I have discussed the post-operative course to date, and the expected progress moving forward.  The patient understands what complications to be concerned about.    Activity plan: increase slowly, no lifting > 25 pounds.  Pelvic rest x 6 weeks  Return in about 4 weeks (around 10/16/2019) for Six Week Postpartum, Mirena IUD insertion.  10/18/2019, MD 09/18/2019 11:34 AM

## 2019-09-18 NOTE — Telephone Encounter (Signed)
Noted. Will order to arrive by apt date/time. 

## 2019-09-19 ENCOUNTER — Ambulatory Visit: Payer: Medicaid Other | Admitting: Obstetrics and Gynecology

## 2019-10-21 ENCOUNTER — Ambulatory Visit: Payer: Medicaid Other | Admitting: Obstetrics and Gynecology

## 2019-10-21 NOTE — Telephone Encounter (Signed)
Per Epic, pt r/s apt to 11/11/19 w/SDJ. Noted. Mirena placed back in stock. Will reserve again closer to apt date.

## 2019-11-11 ENCOUNTER — Other Ambulatory Visit: Payer: Self-pay

## 2019-11-11 ENCOUNTER — Other Ambulatory Visit (HOSPITAL_COMMUNITY)
Admission: RE | Admit: 2019-11-11 | Discharge: 2019-11-11 | Disposition: A | Discharge status: Home / Self Care | Payer: Medicaid Other | Source: Ambulatory Visit | Attending: Obstetrics and Gynecology | Admitting: Obstetrics and Gynecology

## 2019-11-11 ENCOUNTER — Encounter: Payer: Self-pay | Admitting: Obstetrics and Gynecology

## 2019-11-11 ENCOUNTER — Ambulatory Visit (INDEPENDENT_AMBULATORY_CARE_PROVIDER_SITE_OTHER): Payer: Medicaid Other | Admitting: Obstetrics and Gynecology

## 2019-11-11 DIAGNOSIS — Z124 Encounter for screening for malignant neoplasm of cervix: Secondary | ICD-10-CM | POA: Diagnosis present

## 2019-11-11 DIAGNOSIS — Z30430 Encounter for insertion of intrauterine contraceptive device: Secondary | ICD-10-CM | POA: Diagnosis not present

## 2019-11-11 MED ORDER — LEVONORGESTREL 20 MCG/24HR IU IUD: 1.0000 | INTRAUTERINE_SYSTEM | Freq: Once | INTRAUTERINE | 0 refills | Status: AC

## 2019-11-11 NOTE — Progress Notes (Signed)
Postpartum Visit   Chief Complaint  Patient presents with  . Postpartum Care   History of Present Illness: Patient is a 24 y.o. G2P1011 presents for postpartum visit.  Date of delivery: 08/31/2019 Type of delivery: C-Section Episiotomy No.  Laceration:   Pregnancy or labor problems:  no Any problems since the delivery:  no  Newborn Details:  SINGLETON :  1. Baby's name: Arianna. Birth weight: 7.3lb Maternal Details:  Breast Feeding:  Bottle Post partum depression/anxiety noted:  no Edinburgh Post-Partum Depression Score:  0  Date of last PAP: not previously done due to age  Past Medical History:  Diagnosis Date  . Medical history non-contributory     Past Surgical History:  Procedure Laterality Date  . CESAREAN SECTION N/A 08/31/2019   Procedure: CESAREAN SECTION;  Surgeon: Conard Novak, MD;  Location: [facility] ORS;  Service: Obstetrics;  Laterality: N/A;    Prior to Admission medications: denies     No Known Allergies   Social History   Socioeconomic History  . Marital status: Single    Spouse name: Not on file  . Number of children: Not on file  . Years of education: Not on file  . Highest education level: Not on file  Occupational History  . Occupation: unemployed  Tobacco Use  . Smoking status: Never Smoker  . Smokeless tobacco: Never Used  . Tobacco comment: Quit with current pregnancy  Substance and Sexual Activity  . Alcohol use: No  . Drug use: No  . Sexual activity: Yes    Partners: Male    Birth control/protection: Other-see comments    Comment: planning nexplanon or IUD  Other Topics Concern  . Not on file  Social History Narrative  . Not on file   Social Determinants of Health   Financial Resource Strain:   . Difficulty of Paying Living Expenses:   Food Insecurity:   . Worried About Programme researcher, broadcasting/film/video in the Last Year:   . Barista in the Last Year:   Transportation Needs:   . Freight forwarder (Medical):   Marland Kitchen Lack of  Transportation (Non-Medical):   Physical Activity:   . Days of Exercise per Week:   . Minutes of Exercise per Session:   Stress:   . Feeling of Stress :   Social Connections:   . Frequency of Communication with Friends and Family:   . Frequency of Social Gatherings with Friends and Family:   . Attends Religious Services:   . Active Member of Clubs or Organizations:   . Attends Banker Meetings:   Marland Kitchen Marital Status:   Intimate Partner Violence:   . Fear of Current or Ex-Partner:   . Emotionally Abused:   Marland Kitchen Physically Abused:   . Sexually Abused:    Family history: Denies history of gynecologic malignancy   Review of Systems  Constitutional: Negative.   HENT: Negative.   Eyes: Negative.   Respiratory: Negative.   Cardiovascular: Negative.   Gastrointestinal: Negative.   Genitourinary: Negative.   Musculoskeletal: Negative.   Skin: Negative.   Neurological: Negative.   Psychiatric/Behavioral: Negative.      Physical Exam BP 122/74   Ht 4\' 11"  (1.499 m)   Wt 192 lb (87.1 kg)   LMP 11/05/2019   BMI 38.78 kg/m   Physical Exam Constitutional:      General: She is not in acute distress.    Appearance: Normal appearance. She is well-developed.  Genitourinary:     Pelvic  exam was performed with patient in the lithotomy position.     Vulva, inguinal canal, vagina, cervix, uterus, right adnexa and left adnexa normal.     Uterus is midaxial.  HENT:     Head: Normocephalic and atraumatic.  Eyes:     General: No scleral icterus.    Conjunctiva/sclera: Conjunctivae normal.  Cardiovascular:     Rate and Rhythm: Normal rate and regular rhythm.     Heart sounds: No murmur. No friction rub. No gallop.   Pulmonary:     Effort: Pulmonary effort is normal. No respiratory distress.     Breath sounds: Normal breath sounds. No wheezing or rales.  Abdominal:     General: Bowel sounds are normal. There is no distension.     Palpations: Abdomen is soft. There is no  mass.     Tenderness: There is no abdominal tenderness. There is no guarding or rebound.     Comments: without erythema, induration, warmth, and tenderness. It is clean, dry, and intact.    Musculoskeletal:        General: Normal range of motion.     Cervical back: Normal range of motion and neck supple.  Neurological:     General: No focal deficit present.     Mental Status: She is alert and oriented to person, place, and time.     Cranial Nerves: No cranial nerve deficit.  Skin:    General: Skin is warm and dry.     Findings: No erythema.  Psychiatric:        Mood and Affect: Mood normal.        Behavior: Behavior normal.        Judgment: Judgment normal.     IUD Insertion Procedure Note Patient identified, informed consent performed, consent signed.   Discussed risks of irregular bleeding, cramping, infection, malpositioning, expulsion or uterine perforation of the IUD (1:1000 placements)  which may require further procedure such as laparoscopy.  IUD while effective at preventing pregnancy do not prevent transmission of sexually transmitted diseases and use of barrier methods for this purpose was discussed. Time out was performed.  Urine pregnancy test negative.  Speculum placed in the vagina.  Cervix visualized.  Cleaned with Betadine x 2.  Grasped anteriorly with a single tooth tenaculum.  Uterus sounded to 9 cm. IUD placed per manufacturer's recommendations.  Strings trimmed to 3 cm. Tenaculum was removed, good hemostasis noted.  Patient tolerated procedure well.   Patient was given post-procedure instructions.  She was advised to have backup contraception for one week.  Patient was also asked to check IUD strings periodically and follow up in 4 weeks for IUD check.   Female Chaperone present during breast and/or pelvic exam.  Assessment: 24 y.o. G2P1011 presenting for 6 week postpartum visit  Plan: Problem List Items Addressed This Visit    None    Visit Diagnoses     Postpartum care and examination    -  Primary   Relevant Medications   levonorgestrel (MIRENA) 20 MCG/24HR IUD   Other Relevant Orders   Cytology - PAP   Pap smear for cervical cancer screening       Relevant Orders   Cytology - PAP   Encounter for IUD insertion       Relevant Medications   levonorgestrel (MIRENA) 20 MCG/24HR IUD     1) Contraception Education given regarding options for contraception, including IUD placement.  2)  Pap - ASCCP guidelines and rational discussed.  Patient opts  for routine screening interval  3) Patient underwent screening for postpartum depression with no concerns noted.  4) Follow up 1 year for routine annual exam  Thomasene Mohair, MD 11/11/2019 3:32 PM

## 2019-11-13 LAB — CYTOLOGY - PAP

## 2019-12-09 ENCOUNTER — Ambulatory Visit: Payer: Medicaid Other | Admitting: Obstetrics and Gynecology

## 2019-12-12 ENCOUNTER — Encounter: Payer: Self-pay | Admitting: Obstetrics and Gynecology

## 2019-12-12 ENCOUNTER — Telehealth: Payer: Self-pay | Admitting: Obstetrics and Gynecology

## 2019-12-12 NOTE — Telephone Encounter (Signed)
Left generic VM.  Will send letter for follow up in one year for pap smear, per ASCCP guidelines.

## 2021-05-04 ENCOUNTER — Ambulatory Visit: Payer: Medicaid Other

## 2021-05-05 ENCOUNTER — Ambulatory Visit: Payer: Medicaid Other | Admitting: Obstetrics and Gynecology

## 2021-06-25 NOTE — Telephone Encounter (Signed)
Mirena rcvd/charge 11/11/2019

## 2021-09-01 ENCOUNTER — Encounter: Payer: Self-pay | Admitting: Obstetrics and Gynecology

## 2021-09-01 ENCOUNTER — Other Ambulatory Visit: Payer: Self-pay

## 2021-09-01 ENCOUNTER — Ambulatory Visit (INDEPENDENT_AMBULATORY_CARE_PROVIDER_SITE_OTHER): Payer: Medicaid Other | Admitting: Obstetrics and Gynecology

## 2021-09-01 ENCOUNTER — Other Ambulatory Visit (HOSPITAL_COMMUNITY)
Admission: RE | Admit: 2021-09-01 | Discharge: 2021-09-01 | Disposition: A | Discharge status: Home / Self Care | Payer: Medicaid Other | Source: Ambulatory Visit | Attending: Obstetrics and Gynecology | Admitting: Obstetrics and Gynecology

## 2021-09-01 VITALS — BP 120/80 | Ht 60.0 in | Wt 191.0 lb

## 2021-09-01 DIAGNOSIS — Z1151 Encounter for screening for human papillomavirus (HPV): Secondary | ICD-10-CM | POA: Diagnosis present

## 2021-09-01 DIAGNOSIS — Z30431 Encounter for routine checking of intrauterine contraceptive device: Secondary | ICD-10-CM

## 2021-09-01 DIAGNOSIS — N611 Abscess of the breast and nipple: Secondary | ICD-10-CM

## 2021-09-01 DIAGNOSIS — Z113 Encounter for screening for infections with a predominantly sexual mode of transmission: Secondary | ICD-10-CM | POA: Diagnosis not present

## 2021-09-01 DIAGNOSIS — Z124 Encounter for screening for malignant neoplasm of cervix: Secondary | ICD-10-CM | POA: Diagnosis not present

## 2021-09-01 DIAGNOSIS — R87612 Low grade squamous intraepithelial lesion on cytologic smear of cervix (LGSIL): Secondary | ICD-10-CM

## 2021-09-01 MED ORDER — DICLOXACILLIN SODIUM 500 MG PO CAPS: 500.0000 mg | ORAL_CAPSULE | Freq: Four times a day (QID) | ORAL | 0 refills | Status: AC

## 2021-09-01 NOTE — Progress Notes (Signed)
[place], [facility]   Chief Complaint  Patient presents with   Breast Exam    Lump on RB, soreness on both breasts, swellin on RB x 1 week    HPI:      [name] is a 26 y.o. G2P1011 whose LMP was Patient's last menstrual period was 08/01/2021 (exact date)., presents today for RT breast pain for about 2 wks, with mass over the past week, getting larger. Very tender to touch. Drained last night and feels better today. No d/c today. No fevers, trauma, no hx of prior breast issues. No FH breast/ovar cancer. Had bilat breast tenderness 2 wks ago mid cycle, but LT breast no longer tender.  Pt has Mirena, placed 11/11/19. Menses are monthly/irregular, lasting 7 days, mod flow, no BTB, mild dysmen.  She is sex active, no pain/bleeding.  LGSIL on 4/21 pap; due for repeat and STD testing.   Patient Active Problem List   Diagnosis Date Noted   Status post cesarean delivery 08/31/2019   Labor and delivery, indication for care 08/27/2019   Maternal varicella, non-immune 05/13/2019   Supervision of other normal pregnancy, antepartum 01/29/2019   Adult BMI 40.0-44.9 kg/sq m ([place]) 09/14/2017    Past Surgical History:  Procedure Laterality Date   CESAREAN SECTION N/A 08/31/2019   Procedure: CESAREAN SECTION;  Surgeon: Will Bonnet, MD;  Location: [facility] ORS;  Service: Obstetrics;  Laterality: N/A;    History reviewed. No pertinent family history.  Social History   Socioeconomic History   Marital status: Single    Spouse name: Not on file   Number of children: Not on file   Years of education: Not on file   Highest education level: Not on file  Occupational History   Occupation: unemployed  Tobacco Use   Smoking status: Never   Smokeless tobacco: Never  Vaping Use   Vaping Use: Never used  Substance and Sexual Activity   Alcohol use: No   Drug use: No   Sexual activity: Yes    Partners: Male    Birth control/protection: I.U.D.    Comment: Mirena  Other  Topics Concern   Not on file  Social History Narrative   Not on file   Social Determinants of Health   Financial Resource Strain: Not on file  Food Insecurity: Not on file  Transportation Needs: Not on file  Physical Activity: Not on file  Stress: Not on file  Social Connections: Not on file  Intimate Partner Violence: Not on file    Outpatient Medications Prior to Visit  Medication Sig Dispense Refill   levonorgestrel (MIRENA) 20 MCG/24HR IUD 1 Intra Uterine Device (1 each total) by Intrauterine route once for 1 dose. 1 each 0   ferrous sulfate 325 (65 FE) MG tablet Take 1 tablet (325 mg total) by mouth daily with breakfast. (Patient not taking: Reported on 09/18/2019) 100 tablet 3   ibuprofen (ADVIL) 600 MG tablet Take 1 tablet (600 mg total) by mouth every 6 (six) hours. (Patient not taking: Reported on 11/11/2019) 30 tablet 1   oxyCODONE-acetaminophen (PERCOCET/ROXICET) 5-325 MG tablet Take 1 tablet by mouth every 6 (six) hours as needed for moderate pain (pain score 4-7/10). (Patient not taking: Reported on 09/18/2019) 30 tablet 0   Prenatal Multivit-Min-Fe-FA (PRENATAL VITAMINS) 0.8 MG tablet Take 1 tablet by mouth daily. (Patient not taking: Reported on 09/18/2019) 30 tablet 12   No facility-administered medications prior to visit.      ROS:  Review of Systems  Constitutional:  Negative for fever.  Gastrointestinal:  Negative for blood in stool, constipation, diarrhea, nausea and vomiting.  Genitourinary:  Negative for dyspareunia, dysuria, flank pain, frequency, hematuria, urgency, vaginal bleeding, vaginal discharge and vaginal pain.  Musculoskeletal:  Negative for back pain.  Skin:  Negative for rash.  BREAST: pain/drainage   OBJECTIVE:   Vitals:  BP 120/80    Ht 5' (1.524 m)    Wt 191 lb (86.6 kg)    LMP 08/01/2021 (Exact Date)    Breastfeeding No    BMI 37.30 kg/m   Physical Exam Vitals reviewed.  Constitutional:      Appearance: She is well-developed.   Pulmonary:     Effort: Pulmonary effort is normal.  Chest:  Breasts:    Breasts are symmetrical.     Right: Mass, skin change and tenderness present. No inverted nipple or nipple discharge.     Left: No inverted nipple, mass, nipple discharge, skin change or tenderness.    Genitourinary:    General: Normal vulva.     Pubic Area: No rash.      Labia:        Right: No rash, tenderness or lesion.        Left: No rash, tenderness or lesion.      Vagina: Normal. No vaginal discharge, erythema or tenderness.     Cervix: Normal.     Uterus: Normal. Not enlarged and not tender.      Adnexa: Right adnexa normal and left adnexa normal.       Right: No mass or tenderness.         Left: No mass or tenderness.       Comments: IUD STRINGS IN CX OS Musculoskeletal:        General: Normal range of motion.     Cervical back: Normal range of motion.  Lymphadenopathy:     Upper Body:     Right upper body: No supraclavicular or axillary adenopathy.     Left upper body: No supraclavicular or axillary adenopathy.  Skin:    General: Skin is warm and dry.  Neurological:     General: No focal deficit present.     Mental Status: She is alert and oriented to person, place, and time.     Cranial Nerves: No cranial nerve deficit.  Psychiatric:        Mood and Affect: Mood normal.        Behavior: Behavior normal.        Thought Content: Thought content normal.        Judgment: Judgment normal.    Assessment/Plan: Breast abscess - Plan: dicloxacillin (DYNAPEN) 500 MG capsule; tender mass RT breast under nipple with d/c yesterday. Question abscess causing mass or mass with secondary skin infection. Rx diclox/warm compresses. RTO in 1 wk for f/u/sooner prn. May need breast u/s.   Cervical cancer screening - Plan: Cytology - PAP  Screening for STD (sexually transmitted disease) - Plan: Cytology - PAP  LGSIL on Pap smear of cervix - Plan: Cytology - PAP; repeat pap today. Will f/u with results.    Screening for HPV (human papillomavirus) - Plan: Cytology - PAP  Encounter for routine checking of intrauterine contraceptive device (IUD); IUD strings in cx os    Meds ordered this encounter  Medications   dicloxacillin (DYNAPEN) 500 MG capsule    Sig: Take 1 capsule (500 mg total) by mouth 4 (four) times daily for 10 days.  Dispense:  40 capsule    Refill:  0    Order Specific Question:   Supervising Provider    Answer:   Gae Dry [id number]      Return in about 1 week (around 09/08/2021) for breast f/u.  Adelbert Gaspard B. Serapio Edelson, PA-C 09/01/2021 9:23 AM

## 2021-09-03 LAB — CYTOLOGY - PAP
Chlamydia: NEGATIVE
Comment: NEGATIVE
Comment: NEGATIVE
Comment: NORMAL
Diagnosis: NEGATIVE
Diagnosis: REACTIVE
High risk HPV: NEGATIVE
Neisseria Gonorrhea: NEGATIVE

## 2021-09-08 ENCOUNTER — Ambulatory Visit: Payer: Medicaid Other | Admitting: Obstetrics and Gynecology

## 2021-09-22 ENCOUNTER — Other Ambulatory Visit: Payer: Self-pay

## 2022-08-15 ENCOUNTER — Encounter: Payer: Self-pay | Admitting: Emergency Medicine

## 2022-08-15 ENCOUNTER — Emergency Department
Admission: EM | Admit: 2022-08-15 | Discharge: 2022-08-15 | Disposition: A | Discharge status: Home / Self Care | Payer: Medicaid Other | Attending: Emergency Medicine | Admitting: Emergency Medicine

## 2022-08-15 ENCOUNTER — Emergency Department: Payer: Medicaid Other

## 2022-08-15 DIAGNOSIS — E876 Hypokalemia: Secondary | ICD-10-CM | POA: Insufficient documentation

## 2022-08-15 DIAGNOSIS — R1013 Epigastric pain: Secondary | ICD-10-CM | POA: Insufficient documentation

## 2022-08-15 DIAGNOSIS — R1011 Right upper quadrant pain: Secondary | ICD-10-CM | POA: Diagnosis not present

## 2022-08-15 LAB — URINALYSIS, ROUTINE W REFLEX MICROSCOPIC
Bilirubin Urine: NEGATIVE
Glucose, UA: NEGATIVE mg/dL
Ketones, ur: NEGATIVE mg/dL
Leukocytes,Ua: NEGATIVE
Nitrite: NEGATIVE
Protein, ur: NEGATIVE mg/dL
Specific Gravity, Urine: 1.018 (ref 1.005–1.030)
pH: 5 (ref 5.0–8.0)

## 2022-08-15 LAB — CBC
HCT: 39.8 % (ref 36.0–46.0)
Hemoglobin: 13.1 g/dL (ref 12.0–15.0)
MCH: 29 pg (ref 26.0–34.0)
MCHC: 32.9 g/dL (ref 30.0–36.0)
MCV: 88.1 fL (ref 80.0–100.0)
Platelets: 325 10*3/uL (ref 150–400)
RBC: 4.52 MIL/uL (ref 3.87–5.11)
RDW: 13.1 % (ref 11.5–15.5)
WBC: 10.2 10*3/uL (ref 4.0–10.5)
nRBC: 0 % (ref 0.0–0.2)

## 2022-08-15 LAB — COMPREHENSIVE METABOLIC PANEL
ALT: 70 U/L — ABNORMAL HIGH (ref 0–44)
AST: 114 U/L — ABNORMAL HIGH (ref 15–41)
Albumin: 3.7 g/dL (ref 3.5–5.0)
Alkaline Phosphatase: 81 U/L (ref 38–126)
Anion gap: 8 (ref 5–15)
BUN: 10 mg/dL (ref 6–20)
CO2: 23 mmol/L (ref 22–32)
Calcium: 8.8 mg/dL — ABNORMAL LOW (ref 8.9–10.3)
Chloride: 107 mmol/L (ref 98–111)
Creatinine, Ser: 0.8 mg/dL (ref 0.44–1.00)
GFR, Estimated: >60 mL/min (ref >60)
Glucose, Bld: 106 mg/dL — ABNORMAL HIGH (ref 70–99)
Potassium: 3 mmol/L — ABNORMAL LOW (ref 3.5–5.1)
Sodium: 138 mmol/L (ref 135–145)
Total Bilirubin: 0.7 mg/dL (ref 0.3–1.2)
Total Protein: 7.2 g/dL (ref 6.5–8.1)

## 2022-08-15 LAB — LIPASE, BLOOD: Lipase: 32 U/L (ref 11–51)

## 2022-08-15 LAB — POC URINE PREG, ED: Preg Test, Ur: NEGATIVE

## 2022-08-15 MED ORDER — POTASSIUM CHLORIDE 20 MEQ PO PACK
40.0000 meq | PACK | Freq: Once | ORAL | Status: AC
  Administered 2022-08-15: 40 meq via ORAL
  Filled 2022-08-15: qty 2

## 2022-08-15 MED ORDER — OMEPRAZOLE MAGNESIUM 20 MG PO TBEC: 20.0000 mg | DELAYED_RELEASE_TABLET | Freq: Every day | ORAL | 0 refills | Status: AC

## 2022-08-15 MED ORDER — FAMOTIDINE 20 MG PO TABS: 20.0000 mg | ORAL_TABLET | Freq: Two times a day (BID) | ORAL | 0 refills | Status: DC | PRN

## 2022-08-15 MED ORDER — ALUM & MAG HYDROXIDE-SIMETH 200-200-20 MG/5ML PO SUSP
30.0000 mL | Freq: Once | ORAL | Status: AC
  Administered 2022-08-15: 30 mL via ORAL
  Filled 2022-08-15: qty 30

## 2022-08-15 NOTE — ED Triage Notes (Signed)
Pt presents via POV with complaints of epigastric pain that started 2-3 days ago. Pt states that the pain has gotten more intense this AM and worse when lying flat. No meds taken PTA. Denies CP or SOB.

## 2022-08-15 NOTE — Discharge Instructions (Signed)
Your ultrasound is normal.  You had a slightly low potassium level so this was repleted in the emergency department.  You may take the medications as prescribed to help with your pain.  In the meantime, please also avoid any gastric irritants including caffeine, alcohol, spicy food, acidic food.  Please return for any new, worsening, or change in symptoms or other concerns.  It was a pleasure caring for you today.

## 2022-08-15 NOTE — ED Provider Notes (Signed)
[facility] Provider Note    Event Date/Time   First MD Initiated Contact with Patient 08/15/22 [phone]     (approximate)   History   Abdominal Pain   HPI  Amy Duncan is a 27 y.o. female with a past medical history of obesity presents today for evaluation of epigastric pain.  She reports that this began at approximately 4 AM this morning.  She reports that the pain feels like a stabbing pain.  She feels nauseated but has not had any vomiting.  She denies fevers or chills.  No urinary symptoms.  No diarrhea.  She has not taken anything for her symptoms.  She has not eaten anything today.  Patient Active Problem List   Diagnosis Date Noted   Status post cesarean delivery 08/31/2019   Labor and delivery, indication for care 08/27/2019   Maternal varicella, non-immune 05/13/2019   Supervision of other normal pregnancy, antepartum 01/29/2019   Adult BMI 40.0-44.9 kg/sq m ([place]) 09/14/2017          Physical Exam   Triage Vital Signs: ED Triage Vitals  Enc Vitals Group     BP 08/15/22 0609 127/71     Pulse Rate 08/15/22 0609 81     Resp 08/15/22 0609 20     Temp 08/15/22 0609 97.6 F (36.4 C)     Temp Source 08/15/22 0609 Oral     SpO2 08/15/22 0609 98 %     Weight 08/15/22 0604 192 lb 14.4 oz (87.5 kg)     Height 08/15/22 0604 5' (1.524 m)     Head Circumference --      Peak Flow --      Pain Score 08/15/22 0604 10     Pain Loc --      Pain Edu? --      Excl. in [place]? --     Most recent vital signs: Vitals:   08/15/22 0800 08/15/22 0900  BP: 108/64 106/65  Pulse: 76 71  Resp: 16 16  Temp:    SpO2: 100% 98%    Physical Exam Vitals and nursing note reviewed.  Constitutional:      General: Awake and alert. No acute distress.    Appearance: Normal appearance. The patient is overweight.  HENT:     Head: Normocephalic and atraumatic.     Mouth: Mucous membranes are moist.  Eyes:     General: PERRL. Normal EOMs        Right eye: No  discharge.        Left eye: No discharge.     Conjunctiva/sclera: Conjunctivae normal.  Cardiovascular:     Rate and Rhythm: Normal rate and regular rhythm.     Pulses: Normal pulses.     Heart sounds: Normal heart sounds Pulmonary:     Effort: Pulmonary effort is normal. No respiratory distress.     Breath sounds: Normal breath sounds.  Abdominal:     Abdomen is soft. There is mild epigastric abdominal tenderness. No rebound or guarding. No distention. Musculoskeletal:        General: No swelling. Normal range of motion.     Cervical back: Normal range of motion and neck supple.  Skin:    General: Skin is warm and dry.     Capillary Refill: Capillary refill takes less than 2 seconds.     Findings: No rash.  Neurological:     Mental Status: The patient is awake and alert.  ED Results / Procedures / Treatments   Labs (all labs ordered are listed, but only abnormal results are displayed) Labs Reviewed  COMPREHENSIVE METABOLIC PANEL - Abnormal; Notable for the following components:      Result Value   Potassium 3.0 (*)    Glucose, Bld 106 (*)    Calcium 8.8 (*)    AST 114 (*)    ALT 70 (*)    All other components within normal limits  URINALYSIS, ROUTINE W REFLEX MICROSCOPIC - Abnormal; Notable for the following components:   Color, Urine YELLOW (*)    APPearance HAZY (*)    Hgb urine dipstick MODERATE (*)    Bacteria, UA RARE (*)    All other components within normal limits  LIPASE, BLOOD  CBC  POC URINE PREG, ED     EKG     RADIOLOGY I independently reviewed and interpreted imaging and agree with radiologists findings.     PROCEDURES:  Critical Care performed:   Procedures   MEDICATIONS ORDERED IN ED: Medications  alum & mag hydroxide-simeth (MAALOX/MYLANTA) 200-200-20 MG/5ML suspension 30 mL (30 mLs Oral Given 08/15/22 0752)  potassium chloride (KLOR-CON) packet 40 mEq (40 mEq Oral Given 08/15/22 0857)     IMPRESSION / MDM / ASSESSMENT AND  PLAN / ED COURSE  I reviewed the triage vital signs and the nursing notes.   Differential diagnosis includes, but is not limited to, cholecystitis, biliary colic, pancreatitis, gastritis, peptic ulcer disease.  Patient is awake and alert, hemodynamically stable and afebrile.  Labs obtained in triage revealed a mild AST/ALT elevation.  No previous to compare to.  I reviewed the patient's chart.  In care everywhere, patient had an ALT of 51 6 years ago.  No more recent LFTs.  I recommended right upper quadrant ultrasound for further evaluation.  Ultrasound does not reveal any right upper quadrant pathology.  Patient reports that her symptoms have resolved entirely after the GI cocktail.  She was started on PPI and H2 blocker.  We discussed return precautions and the importance of close outpatient follow-up.  We also discussed trying to refrain from consuming gastric irritants which were discussed with her in detail.  Recommended outpatient follow-up for LFT recheck.  Patient and her mother understand and agree with plan.  She was discharged in stable condition.   Patient's presentation is most consistent with acute complicated illness / injury requiring diagnostic workup.   Clinical Course as of 08/15/22 0932  Mon Aug 15, 2022  0919 Patient reports that her pain resolved after the GI cocktail [JP]    Clinical Course User Index [JP] Ronia Hazelett, Clarnce Flock, PA-C     FINAL CLINICAL IMPRESSION(S) / ED DIAGNOSES   Final diagnoses:  Epigastric abdominal pain  Hypokalemia     Rx / DC Orders   ED Discharge Orders          Ordered    omeprazole (PRILOSEC OTC) 20 MG tablet  Daily        08/15/22 0921    famotidine (PEPCID) 20 MG tablet  2 times daily PRN        08/15/22 6270             Note:  This document was prepared using Dragon voice recognition software and may include unintentional dictation errors.   Emeline Gins 08/15/22 0932    Harvest Dark, MD 08/15/22  1439

## 2022-09-16 DIAGNOSIS — Z1152 Encounter for screening for COVID-19: Secondary | ICD-10-CM | POA: Diagnosis not present

## 2022-09-16 DIAGNOSIS — R112 Nausea with vomiting, unspecified: Secondary | ICD-10-CM | POA: Diagnosis not present

## 2022-09-16 DIAGNOSIS — K529 Noninfective gastroenteritis and colitis, unspecified: Secondary | ICD-10-CM | POA: Diagnosis not present

## 2022-09-16 DIAGNOSIS — Z20822 Contact with and (suspected) exposure to covid-19: Secondary | ICD-10-CM | POA: Diagnosis not present

## 2023-05-10 NOTE — Progress Notes (Deleted)
   No chief complaint on file.    History of Present Illness:  Amy Duncan is a 27 y.o. that had a Mirena IUD placed 11/11/19   t time, she denies dyspareunia, pelvic pain, non-menstrual bleeding, vaginal d/c, heavy bleeding.   Neg pap 09/01/21 No STD tsting  There were no vitals taken for this visit.  Pelvic exam:  Two IUD strings {DESC; PRESENT/ABSENT:17923} seen coming from the cervical os. EGBUS, vaginal vault and cervix: within normal limits  IUD Removal Strings of IUD identified and grasped.  IUD removed without problem with ring forceps.  Pt tolerated this well.  IUD noted to be intact.  Assessment:  No diagnosis found.    Plan: IUD removed and plan for contraception is {PLAN CONTRACEPTION:313102}. She was amenable to this plan.  Laurine Kuyper B. Anae Hams, PA-C 05/10/2023 3:16 PM

## 2023-05-11 ENCOUNTER — Ambulatory Visit: Payer: 59 | Admitting: Obstetrics and Gynecology

## 2023-05-11 DIAGNOSIS — Z30432 Encounter for removal of intrauterine contraceptive device: Secondary | ICD-10-CM

## 2023-05-11 DIAGNOSIS — Z113 Encounter for screening for infections with a predominantly sexual mode of transmission: Secondary | ICD-10-CM

## 2023-05-29 ENCOUNTER — Encounter: Payer: Self-pay | Admitting: Obstetrics and Gynecology

## 2023-12-07 ENCOUNTER — Other Ambulatory Visit (HOSPITAL_COMMUNITY)
Admission: RE | Admit: 2023-12-07 | Discharge: 2023-12-07 | Disposition: A | Discharge status: Home / Self Care | Source: Ambulatory Visit | Attending: Obstetrics and Gynecology | Admitting: Obstetrics and Gynecology

## 2023-12-07 ENCOUNTER — Encounter: Payer: Self-pay | Admitting: Obstetrics and Gynecology

## 2023-12-07 ENCOUNTER — Ambulatory Visit (INDEPENDENT_AMBULATORY_CARE_PROVIDER_SITE_OTHER): Admitting: Obstetrics and Gynecology

## 2023-12-07 VITALS — BP 112/72 | HR 82 | Ht 60.0 in | Wt 177.0 lb

## 2023-12-07 DIAGNOSIS — B9689 Other specified bacterial agents as the cause of diseases classified elsewhere: Secondary | ICD-10-CM

## 2023-12-07 DIAGNOSIS — Z30431 Encounter for routine checking of intrauterine contraceptive device: Secondary | ICD-10-CM | POA: Diagnosis not present

## 2023-12-07 DIAGNOSIS — R102 Pelvic and perineal pain: Secondary | ICD-10-CM | POA: Diagnosis not present

## 2023-12-07 DIAGNOSIS — N760 Acute vaginitis: Secondary | ICD-10-CM

## 2023-12-07 DIAGNOSIS — Z113 Encounter for screening for infections with a predominantly sexual mode of transmission: Secondary | ICD-10-CM | POA: Diagnosis present

## 2023-12-07 LAB — POCT WET PREP WITH KOH
Clue Cells Wet Prep HPF POC: POSITIVE
KOH Prep POC: POSITIVE — AB
Trichomonas, UA: NEGATIVE
Yeast Wet Prep HPF POC: NEGATIVE

## 2023-12-07 MED ORDER — METRONIDAZOLE 500 MG PO TABS: ORAL_TABLET | ORAL | 0 refills | Status: AC

## 2023-12-07 NOTE — Patient Instructions (Signed)
 I value your feedback and you entrusting [place] with your care. If you get a [place] patient survey, I would appreciate you taking the time to let us know about your experience today. Thank you! ? ? ?

## 2023-12-07 NOTE — Progress Notes (Signed)
 [place], [facility]   Chief Complaint  Patient presents with   Pelvic Pain    Noticed severe pelvic pain couple of months ago, lately pelvic pain is worse, during cycle only. Abnormal vaginal odor for the last year.    HPI:      [name] is a 28 y.o. G2P1011 whose LMP was Patient's last menstrual period was 12/01/2023 (exact date)., presents today for pelvic pain and vag d/c.  Menses are monthly, lasting 7 days, mod flow, no clots, 1-2 days BTB since having pelvic pain/few months, mild crampy dysmen improved with NSAIDs. Also now with dull, constant suprapubic pain with her period, lasting about 3 days, for a few months that is not relieved with NSAIDs. Does have constipation in general, no sx change with pain; no urin sx.  Mirena  placed 11/11/19 PP. Always with monthly menses with IUD. She is sexually active, no new partners. No pain/bleeding; does have some dryness.  Also with increased vag d/c with odor, no itching for the past year. No Rx meds to treat, no formal dx of BV. Has tried boric acid supp without relief. Uses scented dove soap, dryer sheets; no longer taking probiotics. Wearing cotton regular underwear.  Neg pap 2/23; no recent STD testing  Patient Active Problem List   Diagnosis Date Noted   BV (bacterial vaginosis) 12/07/2023   Status post cesarean delivery 08/31/2019   Labor and delivery, indication for care 08/27/2019   Maternal varicella, non-immune 05/13/2019   Supervision of other normal pregnancy, antepartum 01/29/2019   Adult BMI 40.0-44.9 kg/sq m ([facility]) 09/14/2017    Past Surgical History:  Procedure Laterality Date   CESAREAN SECTION N/A 08/31/2019   Procedure: CESAREAN SECTION;  Surgeon: Kris Pester, MD;  Location: [facility] ORS;  Service: Obstetrics;  Laterality: N/A;    History reviewed. No pertinent family history.  Social History   Socioeconomic History   Marital status: Single    Spouse name: Not on file   Number of children:  Not on file   Years of education: Not on file   Highest education level: Not on file  Occupational History   Occupation: unemployed  Tobacco Use   Smoking status: Never   Smokeless tobacco: Never  Vaping Use   Vaping status: Every Day  Substance and Sexual Activity   Alcohol use: No   Drug use: No   Sexual activity: Yes    Partners: Male    Birth control/protection: I.U.D.    Comment: Mirena   Other Topics Concern   Not on file  Social History Narrative   Not on file   Social Drivers of Health   Financial Resource Strain: Not on file  Food Insecurity: Not on file  Transportation Needs: Not on file  Physical Activity: Not on file  Stress: Not on file  Social Connections: Not on file  Intimate Partner Violence: Not on file    Outpatient Medications Prior to Visit  Medication Sig Dispense Refill   famotidine  (PEPCID ) 20 MG tablet Take 1 tablet (20 mg total) by mouth 2 (two) times daily as needed for up to 14 days for heartburn or indigestion. 28 tablet 0   levonorgestrel  (MIRENA ) 20 MCG/24HR IUD 1 Intra Uterine Device (1 each total) by Intrauterine route once for 1 dose. 1 each 0   omeprazole  (PRILOSEC  OTC) 20 MG tablet Take 1 tablet (20 mg total) by mouth daily for 28 days. 30 minutes before eating breakfast 28 tablet 0  No facility-administered medications prior to visit.      ROS:  Review of Systems  Constitutional:  Negative for fever.  Gastrointestinal:  Negative for blood in stool, constipation, diarrhea, nausea and vomiting.  Genitourinary:  Positive for pelvic pain and vaginal discharge. Negative for dyspareunia, dysuria, flank pain, frequency, hematuria, urgency, vaginal bleeding and vaginal pain.  Musculoskeletal:  Negative for back pain.  Skin:  Negative for rash.   BREAST: No symptoms   OBJECTIVE:   Vitals:  BP 112/72   Pulse 82   Ht 5' (1.524 m)   Wt 177 lb (80.3 kg)   LMP 12/01/2023 (Exact Date)   BMI 34.57 kg/m   Physical Exam Vitals  reviewed.  Constitutional:      Appearance: She is well-developed.  Pulmonary:     Effort: Pulmonary effort is normal.  Genitourinary:    General: Normal vulva.     Pubic Area: No rash.      Labia:        Right: No rash, tenderness or lesion.        Left: No rash, tenderness or lesion.      Vagina: Vaginal discharge present. No erythema or tenderness.     Cervix: Normal.     Uterus: Normal. Tender. Not enlarged.      Adnexa: Right adnexa normal and left adnexa normal.       Right: No mass or tenderness.         Left: No mass or tenderness.       Comments: WHITE VAG D/C; IUD STRINGS IN CX OS (QUESTION SLIGHTLY LONG VS NORMAL CUT LENGTH) Musculoskeletal:        General: Normal range of motion.     Cervical back: Normal range of motion.  Skin:    General: Skin is warm and dry.  Neurological:     General: No focal deficit present.     Mental Status: She is alert and oriented to person, place, and time.  Psychiatric:        Mood and Affect: Mood normal.        Behavior: Behavior normal.        Thought Content: Thought content normal.        Judgment: Judgment normal.     Results: Results for orders placed or performed in visit on 12/07/23 (from the past 24 hours)  POCT Wet Prep with KOH     Status: Abnormal   Collection Time: 12/07/23 12:51 PM  Result Value Ref Range   Trichomonas, UA Negative    Clue Cells Wet Prep HPF POC pos    Epithelial Wet Prep HPF POC     Yeast Wet Prep HPF POC neg    Bacteria Wet Prep HPF POC     RBC Wet Prep HPF POC     WBC Wet Prep HPF POC     KOH Prep POC Positive (A) Negative     Assessment/Plan: Pelvic pain - Plan: US  PELVIC COMPLETE WITH TRANSVAGINAL; suprapubic sx with menses; IUD strings in cx os. Rule out STDs, tx BV with flagyl ; check Gyn u/s. Will f/u with results.   Screening for STD (sexually transmitted disease) - Plan: Cervicovaginal ancillary only  Encounter for routine checking of intrauterine contraceptive device (IUD) -  Plan: US  PELVIC COMPLETE WITH TRANSVAGINAL; check placement with GYN u/s  BV (bacterial vaginosis) - Plan: metroNIDAZOLE  (FLAGYL ) 500 MG tablet, POCT Wet Prep with KOH; pos sx and wet prep. Rx flagyl , no EtOH. Dove sens skin soap,  line dry underwear, resume probiotics, boric acid supp prn. Will RF if sx recur.   Meds ordered this encounter  Medications   metroNIDAZOLE  (FLAGYL ) 500 MG tablet    Sig: Take 1 tab BID for 7 days; NO alcohol use for 10 days after prescription start    Dispense:  14 tablet    Refill:  0    Supervising Provider:   Sofia Dunn [[id number]]      Return if symptoms worsen or fail to improve.  Harly Pipkins B. Siren Porrata, PA-C 12/07/2023 12:53 PM

## 2023-12-08 LAB — CERVICOVAGINAL ANCILLARY ONLY
Chlamydia: NEGATIVE
Comment: NEGATIVE
Comment: NEGATIVE
Comment: NORMAL
Neisseria Gonorrhea: NEGATIVE
Trichomonas: NEGATIVE

## 2024-01-03 ENCOUNTER — Encounter: Payer: Self-pay | Admitting: Obstetrics and Gynecology
